# Patient Record
Sex: Male | Born: 1935 | Race: White | Hispanic: No | Smoking: Former smoker
Health system: Southern US, Community
[De-identification: ages and names within clinical notes are randomized; demographics above are authoritative.]

## PROBLEM LIST (undated history)

## (undated) DIAGNOSIS — E785 Hyperlipidemia, unspecified: Secondary | ICD-10-CM

## (undated) DIAGNOSIS — J189 Pneumonia, unspecified organism: Secondary | ICD-10-CM

## (undated) DIAGNOSIS — I10 Essential (primary) hypertension: Secondary | ICD-10-CM

## (undated) DIAGNOSIS — Z531 Procedure and treatment not carried out because of patient's decision for reasons of belief and group pressure: Secondary | ICD-10-CM

## (undated) HISTORY — PX: TONSILLECTOMY: SUR1361

---

## 1998-09-22 ENCOUNTER — Inpatient Hospital Stay (HOSPITAL_COMMUNITY): Admission: EM | Admit: 1998-09-22 | Discharge: 1998-09-24 | Payer: Self-pay | Admitting: Emergency Medicine

## 1998-09-25 ENCOUNTER — Encounter: Payer: Self-pay | Admitting: Cardiology

## 2003-06-19 ENCOUNTER — Encounter: Admission: RE | Admit: 2003-06-19 | Discharge: 2003-06-19 | Payer: Self-pay | Admitting: Family Medicine

## 2014-03-13 ENCOUNTER — Emergency Department (HOSPITAL_COMMUNITY): Payer: Medicare HMO

## 2014-03-13 ENCOUNTER — Inpatient Hospital Stay (HOSPITAL_COMMUNITY): Payer: Medicare HMO

## 2014-03-13 ENCOUNTER — Encounter (HOSPITAL_COMMUNITY): Payer: Self-pay | Admitting: Emergency Medicine

## 2014-03-13 ENCOUNTER — Inpatient Hospital Stay (HOSPITAL_COMMUNITY)
Admission: EM | Admit: 2014-03-13 | Discharge: 2014-03-17 | DRG: 481 | Disposition: A | Discharge status: Home Health | Payer: Medicare HMO | Attending: Internal Medicine | Admitting: Internal Medicine

## 2014-03-13 DIAGNOSIS — Z887 Allergy status to serum and vaccine status: Secondary | ICD-10-CM

## 2014-03-13 DIAGNOSIS — IMO0001 Reserved for inherently not codable concepts without codable children: Secondary | ICD-10-CM

## 2014-03-13 DIAGNOSIS — E785 Hyperlipidemia, unspecified: Secondary | ICD-10-CM | POA: Diagnosis present

## 2014-03-13 DIAGNOSIS — Z789 Other specified health status: Secondary | ICD-10-CM | POA: Diagnosis present

## 2014-03-13 DIAGNOSIS — Y92009 Unspecified place in unspecified non-institutional (private) residence as the place of occurrence of the external cause: Secondary | ICD-10-CM | POA: Diagnosis not present

## 2014-03-13 DIAGNOSIS — D72829 Elevated white blood cell count, unspecified: Secondary | ICD-10-CM | POA: Diagnosis not present

## 2014-03-13 DIAGNOSIS — S72033A Displaced midcervical fracture of unspecified femur, initial encounter for closed fracture: Secondary | ICD-10-CM | POA: Diagnosis present

## 2014-03-13 DIAGNOSIS — E871 Hypo-osmolality and hyponatremia: Secondary | ICD-10-CM | POA: Diagnosis not present

## 2014-03-13 DIAGNOSIS — W010XXA Fall on same level from slipping, tripping and stumbling without subsequent striking against object, initial encounter: Secondary | ICD-10-CM | POA: Diagnosis present

## 2014-03-13 DIAGNOSIS — Z79899 Other long term (current) drug therapy: Secondary | ICD-10-CM

## 2014-03-13 DIAGNOSIS — E861 Hypovolemia: Secondary | ICD-10-CM | POA: Diagnosis present

## 2014-03-13 DIAGNOSIS — Z87891 Personal history of nicotine dependence: Secondary | ICD-10-CM

## 2014-03-13 DIAGNOSIS — Z8249 Family history of ischemic heart disease and other diseases of the circulatory system: Secondary | ICD-10-CM | POA: Diagnosis not present

## 2014-03-13 DIAGNOSIS — K5909 Other constipation: Secondary | ICD-10-CM | POA: Diagnosis present

## 2014-03-13 DIAGNOSIS — Z880 Allergy status to penicillin: Secondary | ICD-10-CM

## 2014-03-13 DIAGNOSIS — M25559 Pain in unspecified hip: Secondary | ICD-10-CM | POA: Diagnosis not present

## 2014-03-13 DIAGNOSIS — K5900 Constipation, unspecified: Secondary | ICD-10-CM | POA: Diagnosis not present

## 2014-03-13 DIAGNOSIS — Z7982 Long term (current) use of aspirin: Secondary | ICD-10-CM

## 2014-03-13 DIAGNOSIS — S72002A Fracture of unspecified part of neck of left femur, initial encounter for closed fracture: Secondary | ICD-10-CM

## 2014-03-13 DIAGNOSIS — I10 Essential (primary) hypertension: Secondary | ICD-10-CM | POA: Diagnosis present

## 2014-03-13 DIAGNOSIS — S72009A Fracture of unspecified part of neck of unspecified femur, initial encounter for closed fracture: Secondary | ICD-10-CM

## 2014-03-13 DIAGNOSIS — Z8262 Family history of osteoporosis: Secondary | ICD-10-CM

## 2014-03-13 HISTORY — DX: Pneumonia, unspecified organism: J18.9

## 2014-03-13 HISTORY — DX: Essential (primary) hypertension: I10

## 2014-03-13 HISTORY — DX: Hyperlipidemia, unspecified: E78.5

## 2014-03-13 LAB — CBC WITH DIFFERENTIAL/PLATELET
Basophils Absolute: 0 10*3/uL (ref 0.0–0.1)
Basophils Relative: 0 % (ref 0–1)
Eosinophils Absolute: 0 10*3/uL (ref 0.0–0.7)
Eosinophils Relative: 0 % (ref 0–5)
HCT: 36.7 % — ABNORMAL LOW (ref 39.0–52.0)
Hemoglobin: 12.6 g/dL — ABNORMAL LOW (ref 13.0–17.0)
Lymphocytes Relative: 6 % — ABNORMAL LOW (ref 12–46)
Lymphs Abs: 0.7 10*3/uL (ref 0.7–4.0)
MCH: 30.4 pg (ref 26.0–34.0)
MCHC: 34.3 g/dL (ref 30.0–36.0)
MCV: 88.6 fL (ref 78.0–100.0)
Monocytes Absolute: 0.7 10*3/uL (ref 0.1–1.0)
Monocytes Relative: 6 % (ref 3–12)
Neutro Abs: 9.9 10*3/uL — ABNORMAL HIGH (ref 1.7–7.7)
Neutrophils Relative %: 88 % — ABNORMAL HIGH (ref 43–77)
Platelets: 221 10*3/uL (ref 150–400)
RBC: 4.14 MIL/uL — ABNORMAL LOW (ref 4.22–5.81)
RDW: 12.7 % (ref 11.5–15.5)
WBC: 11.4 10*3/uL — ABNORMAL HIGH (ref 4.0–10.5)

## 2014-03-13 LAB — BASIC METABOLIC PANEL
Anion gap: 14 (ref 5–15)
BUN: 15 mg/dL (ref 6–23)
CO2: 23 mEq/L (ref 19–32)
Calcium: 9 mg/dL (ref 8.4–10.5)
Chloride: 97 mEq/L (ref 96–112)
Creatinine, Ser: 0.83 mg/dL (ref 0.50–1.35)
GFR calc Af Amer: >90 mL/min (ref >90)
GFR calc non Af Amer: 80 mL/min — ABNORMAL LOW (ref >90)
Glucose, Bld: 119 mg/dL — ABNORMAL HIGH (ref 70–99)
Potassium: 3.8 mEq/L (ref 3.7–5.3)
Sodium: 134 mEq/L — ABNORMAL LOW (ref 137–147)

## 2014-03-13 MED ORDER — MORPHINE SULFATE 4 MG/ML IJ SOLN
6.0000 mg | Freq: Once | INTRAMUSCULAR | Status: AC
  Administered 2014-03-13: 6 mg via INTRAVENOUS
  Filled 2014-03-13: qty 2

## 2014-03-13 MED ORDER — MORPHINE SULFATE 4 MG/ML IJ SOLN
6.0000 mg | Freq: Once | INTRAMUSCULAR | Status: AC
Start: 2014-03-13 — End: 2014-03-13
  Administered 2014-03-13: 6 mg via INTRAVENOUS
  Filled 2014-03-13: qty 2

## 2014-03-13 MED ORDER — PNEUMOCOCCAL VAC POLYVALENT 25 MCG/0.5ML IJ INJ
0.5000 mL | INJECTION | INTRAMUSCULAR | Status: DC
  Filled 2014-03-13: qty 0.5

## 2014-03-13 MED ORDER — TETANUS-DIPHTH-ACELL PERTUSSIS 5-2.5-18.5 LF-MCG/0.5 IM SUSP
0.5000 mL | Freq: Once | INTRAMUSCULAR | Status: DC
  Filled 2014-03-13: qty 0.5

## 2014-03-13 MED ORDER — INFLUENZA VAC SPLIT QUAD 0.5 ML IM SUSY
0.5000 mL | PREFILLED_SYRINGE | INTRAMUSCULAR | Status: DC
  Filled 2014-03-13: qty 0.5

## 2014-03-13 MED ORDER — MORPHINE SULFATE 2 MG/ML IJ SOLN
0.5000 mg | INTRAMUSCULAR | Status: DC | PRN
  Administered 2014-03-13 – 2014-03-14 (×3): 0.5 mg via INTRAVENOUS
  Filled 2014-03-13 (×3): qty 1

## 2014-03-13 MED ORDER — HYDROCODONE-ACETAMINOPHEN 5-325 MG PO TABS
1.0000 | ORAL_TABLET | Freq: Four times a day (QID) | ORAL | Status: DC | PRN
  Administered 2014-03-13 – 2014-03-16 (×8): 2 via ORAL
  Administered 2014-03-17 (×2): 1 via ORAL
  Filled 2014-03-13 (×8): qty 2
  Filled 2014-03-13 (×2): qty 1

## 2014-03-13 MED ORDER — ONDANSETRON HCL 4 MG/2ML IJ SOLN
4.0000 mg | Freq: Once | INTRAMUSCULAR | Status: AC
  Administered 2014-03-13: 4 mg via INTRAVENOUS
  Filled 2014-03-13: qty 2

## 2014-03-13 NOTE — ED Notes (Signed)
[facility] Called to [facility]

## 2014-03-13 NOTE — Progress Notes (Signed)
  CARE MANAGEMENT ED NOTE 03/13/2014  Patient:  Clayton Odonnell, Clayton Odonnell   Account Number:  [id number]  Date Initiated:  03/13/2014  Documentation initiated by:  Livia Snellen  Subjective/Objective Assessment:   Patient presents to ED post fall with injury to left hip.     Subjective/Objective Assessment Detail:     Action/Plan:   Action/Plan Detail:   Anticipated DC Date:       Status Recommendation to Physician:   Result of Recommendation:    Other ED [place]  Other  PCP issues    Choice offered to / List presented to:            Status of service:  Completed, signed off  ED Comments:   ED Comments Detail:  EDCm spoke to patient at bedside.  Patient confirms his pcp is Dr. Bevelyn Buckles.  System updated.  No further EDCM needs at this time.

## 2014-03-13 NOTE — ED Notes (Signed)
Per EMS pt from home tripped on way in house resulting in left hip pain, shortening, outward rotation.

## 2014-03-13 NOTE — ED Notes (Signed)
Pt reports post xray and movement pain 6/10 in left hip.

## 2014-03-13 NOTE — ED Notes (Signed)
Bed: WA13 Expected date:  Expected time:  Means of arrival:  Comments: ems  

## 2014-03-13 NOTE — ED Provider Notes (Signed)
CSN: [id number]     Arrival date & time 03/13/14  1517 History   First MD Initiated Contact with Patient 03/13/14 1521     Chief Complaint  Patient presents with  . Fall  . Hip Pain     (Consider location/radiation/quality/duration/timing/severity/associated sxs/prior Treatment) HPI  83yM with L hip pain. Mechanical fall. Happened just before arrival. Lost balance. Skin tear L elbow, but denies significant pain there. Did not hit head. No HA, neck or back pain. Tried to get up after fall but unable to bear weight because of pain. No blood thinners.   Past Medical History  Diagnosis Date  . Hypertension   . Hyperlipemia    History reviewed. No pertinent past surgical history. No family history on file. History  Substance Use Topics  . Smoking status: Never Smoker   . Smokeless tobacco: Not on file  . Alcohol Use: 8.4 oz/week    14 Cans of beer per week     Comment: 2 beers a day    Review of Systems  All systems reviewed and negative, other than as noted in HPI.   Allergies  Penicillins  Home Medications   Prior to Admission medications   Medication Sig Start Date End Date Taking? Authorizing Provider  lovastatin (MEVACOR) 10 MG tablet Take 10 mg by mouth at bedtime.   Yes Historical Provider, MD  triamterene-hydrochlorothiazide (MAXZIDE-25) 37.5-25 MG per tablet Take 1 tablet by mouth daily.   Yes Historical Provider, MD   BP 145/69  Pulse 76  Temp(Src) 97.9 F (36.6 C) (Oral)  Resp 16  SpO2 98% Physical Exam  Nursing note and vitals reviewed. Constitutional: He appears well-developed and well-nourished. No distress.  HENT:  Head: Normocephalic and atraumatic.  Eyes: Conjunctivae are normal. Right eye exhibits no discharge. Left eye exhibits no discharge.  Neck: Neck supple.  Cardiovascular: Normal rate, regular rhythm and normal heart sounds.  Exam reveals no gallop and no friction rub.   No murmur heard. Pulmonary/Chest: Effort normal and breath sounds  normal. No respiratory distress.  Abdominal: Soft. He exhibits no distension. There is no tenderness.  Musculoskeletal:  Significant pain with attempted ROM of L hip. Perhaps some mild shortening. Externally rotated. NVI.   Neurological: He is alert.  Skin: Skin is warm and dry.  Psychiatric: He has a normal mood and affect. His behavior is normal. Thought content normal.    ED Course  Procedures (including critical care time) Labs Review Labs Reviewed  CBC WITH DIFFERENTIAL - Abnormal; Notable for the following:    WBC 11.4 (*)    RBC 4.14 (*)    Hemoglobin 12.6 (*)    HCT 36.7 (*)    Neutrophils Relative % 88 (*)    Neutro Abs 9.9 (*)    Lymphocytes Relative 6 (*)    All other components within normal limits  BASIC METABOLIC PANEL - Abnormal; Notable for the following:    Sodium 134 (*)    Glucose, Bld 119 (*)    GFR calc non Af Amer 80 (*)    All other components within normal limits  CBC - Abnormal; Notable for the following:    RBC 4.16 (*)    Hemoglobin 12.7 (*)    HCT 36.7 (*)    All other components within normal limits  BASIC METABOLIC PANEL - Abnormal; Notable for the following:    Sodium 127 (*)    Chloride 90 (*)    Glucose, Bld 114 (*)    GFR calc  non Af Amer 82 (*)    All other components within normal limits  URINALYSIS, ROUTINE W REFLEX MICROSCOPIC - Abnormal; Notable for the following:    Ketones, ur 15 (*)    All other components within normal limits  BASIC METABOLIC PANEL - Abnormal; Notable for the following:    Sodium 130 (*)    Chloride 93 (*)    Glucose, Bld 173 (*)    GFR calc non Af Amer 83 (*)    All other components within normal limits  CBC - Abnormal; Notable for the following:    WBC 14.3 (*)    RBC 4.07 (*)    Hemoglobin 12.4 (*)    HCT 35.6 (*)    All other components within normal limits  CBC - Abnormal; Notable for the following:    RBC 3.95 (*)    Hemoglobin 12.3 (*)    HCT 34.6 (*)    All other components within normal  limits  BASIC METABOLIC PANEL - Abnormal; Notable for the following:    Sodium 132 (*)    Chloride 95 (*)    Glucose, Bld 114 (*)    GFR calc non Af Amer 86 (*)    All other components within normal limits  URINE CULTURE  SURGICAL PCR SCREEN  VITAMIN D 25 HYDROXY  ALBUMIN    Imaging Review Dg Hip Complete Left  03/13/2014   CLINICAL DATA:  Fall, left hip pain  EXAM: LEFT HIP - COMPLETE 2+ VIEW  COMPARISON:  None.  FINDINGS: Nondisplaced transcervical left hip fracture.  Mild degenerative changes of the bilateral hips.  Visualized bony pelvis appears intact.  Degenerative changes of the lower lumbar spine.  IMPRESSION: Nondisplaced transcervical left hip fracture.   Electronically Signed   By: Julian Hy M.D.   On: 03/13/2014 16:44     EKG Interpretation   Date/Time:  Monday March 13 2014 17:07:55 EDT Ventricular Rate:  80 PR Interval:  236 QRS Duration: 107 QT Interval:  420 QTC Calculation: 484 R Axis:   -60 Text Interpretation:  Sinus rhythm Prolonged PR interval Low voltage,  precordial leads RSR' in V1 or V2, right VCD or RVH  ABNORMAL R WAVE  PROGRESSION inferior Q waves No old tracing to compare Confirmed by [place]man   MD, Laniya Friedl ([id number]) on 03/13/2014 5:12:02 PM      MDM   Final diagnoses:  Femoral neck fracture, left, closed, initial encounter    83yM with L hip pain after mechanical fall. Closed L femoral neck fx. Discussed with Dr Alain Marion, ortho. Plans OR tomorrow. NPO after midnight. Requesting transfer to [facility]. Basic pre-op testing. Will discuss with medicine.     Virgel Manifold, MD 03/17/14 [phone]

## 2014-03-13 NOTE — Consult Note (Signed)
ORTHOPAEDIC CONSULTATION  REQUESTING PHYSICIAN: Virgel Manifold, MD  Chief Complaint: left hip fracture  HPI: Clayton Odonnell is a 80 y.o. male who complains of  Mechanical fall. He is a Hydrographic surveyor  Past Medical History  Diagnosis Date  . Hypertension   . Hyperlipemia   . Pneumonia    Past Surgical History  Procedure Laterality Date  . Tonsillectomy     History   Social History  . Marital Status: Married    Spouse Name: N/A    Number of Children: N/A  . Years of Education: N/A   Social History Main Topics  . Smoking status: Former Smoker    Types: Cigarettes    Quit date: 03/13/1953  . Smokeless tobacco: Never Used  . Alcohol Use: 8.4 oz/week    14 Cans of beer per week     Comment: 2 beers a day  . Drug Use: No  . Sexual Activity: None   Other Topics Concern  . None   Social History Narrative  . None   History reviewed. No pertinent family history. Allergies  Allergen Reactions  . Tetanus Toxoids   . Other   . Penicillins Hives and Rash   Prior to Admission medications   Medication Sig Start Date End Date Taking? Authorizing Provider  lovastatin (MEVACOR) 10 MG tablet Take 10 mg by mouth at bedtime.   Yes Historical Provider, MD  triamterene-hydrochlorothiazide (MAXZIDE-25) 37.5-25 MG per tablet Take 1 tablet by mouth daily.   Yes Historical Provider, MD   Dg Hip Complete Left  03/13/2014   CLINICAL DATA:  Fall, left hip pain  EXAM: LEFT HIP - COMPLETE 2+ VIEW  COMPARISON:  None.  FINDINGS: Nondisplaced transcervical left hip fracture.  Mild degenerative changes of the bilateral hips.  Visualized bony pelvis appears intact.  Degenerative changes of the lower lumbar spine.  IMPRESSION: Nondisplaced transcervical left hip fracture.   Electronically Signed   By: Julian Hy M.D.   On: 03/13/2014 16:44   Dg Chest Portable 1 View  03/13/2014   CLINICAL DATA:  Status post fall, hip pain, cough  EXAM: PORTABLE CHEST - 1 VIEW   COMPARISON:  None.  FINDINGS: The heart size and mediastinal contours are within normal limits. Both lungs are clear. The visualized skeletal structures are unremarkable.  IMPRESSION: No active disease.   Electronically Signed   By: Kathreen Devoid   On: 03/13/2014 17:07    Positive ROS: All other systems have been reviewed and were otherwise negative with the exception of those mentioned in the HPI and as above.  Labs cbc  Recent Labs  03/13/14 1734  WBC 11.4*  HGB 12.6*  HCT 36.7*  PLT 221    Labs inflam No results found for this basename: ESR, CRP,  in the last 72 hours  Labs coag No results found for this basename: INR, PT, PTT,  in the last 72 hours   Recent Labs  03/13/14 1734  NA 134*  K 3.8  CL 97  CO2 23  GLUCOSE 119*  BUN 15  CREATININE 0.83  CALCIUM 9.0    Physical Exam: Filed Vitals:   03/13/14 1801  BP: 141/69  Pulse: 83  Temp:   Resp: 16   General: Alert, no acute distress Cardiovascular: No pedal edema Respiratory: No cyanosis, no use of accessory musculature GI: No organomegaly, abdomen is soft and non-tender Skin: No lesions in the area of chief complaint other than those listed below in MSK  exam.  Neurologic: Sensation intact distally Psychiatric: Patient is competent for consent with normal mood and affect Lymphatic: No axillary or cervical lymphadenopathy  MUSCULOSKELETAL:  LLE: pain with log roll. SILT DP/SP/S/S/T nerve, 2+ DP, +TA/GS/EHL Compartments soft  Other extremities are atraumatic with painless ROM and NVI.  Assessment: Left basicervical fem neck fx  Plan: IM nail today.  Weight Bearing Status: Bedrest now, likely TDWB post op PT VTE px: SCD's and chemical post op   Edmonia Lynch, D, MD Cell [phone]   03/13/2014 6:57 PM

## 2014-03-13 NOTE — Progress Notes (Signed)
03/13/2014 A. Ikenna Ohms RNCM 2105pm EDCM spoke to patient at bedside.  Patient reports he does not want to go to a "nursing home" to get rehab after his surgery.  Patient reports his wife is at home who just had a stroke.  Patient reports she is unable to care for herself and the patient is her caretaker.  Patient would rather go home on discharge with outpatient rehab.  Patient reports his family will be able to help at home as well.  EDCM provided patient with list of home health agencies in [facility].  EDCM explained to patient that he may receive a visiting RN, PT, OT aide and social worker if needed.  Patient thankful for resources. No further EDCM needs at this time.

## 2014-03-13 NOTE — H&P (Addendum)
PCP: Donnajean Lopes, MD    Chief Complaint:  Fall at home  HPI: Clayton Odonnell is a 80 y.o. male   has a past medical history of Hypertension; Hyperlipemia; and Pneumonia.   Presented with  Patient was bringing his wife from the hospital she was recently hospitalized with CVA. He tripped and fell hitting his head on the wall. They called 911 and he was brought to ER. Patient was unable to bear weight.  Denies LOS. He have had some nausea afterwards. Orthopedics have been called and recommended admission to [facility] with OR time in AM. Patient denies any chest pain he has been active. No chest pain with exertion.  Patient is a Sales promotion account executive witness.   Hospitalist was called for admission for Left hip fracture.  Review of Systems:    Pertinent positives include:  Fall and pain in left hip. nausea,   Constitutional:  No weight loss, night sweats, Fevers, chills, fatigue, weight loss  HEENT:  No headaches, Difficulty swallowing,Tooth/dental problems,Sore throat,  No sneezing, itching, ear ache, nasal congestion, post nasal drip,  Cardio-vascular:  No chest pain, Orthopnea, PND, anasarca, dizziness, palpitations.no Bilateral lower extremity swelling  GI:  No heartburn, indigestion, abdominal pain, vomiting, diarrhea, change in bowel habits, loss of appetite, melena, blood in stool, hematemesis Resp:  no shortness of breath at rest. No dyspnea on exertion, No excess mucus, no productive cough, No non-productive cough, No coughing up of blood.No change in color of mucus.No wheezing. Skin:  no rash or lesions. No jaundice GU:  no dysuria, change in color of urine, no urgency or frequency. No straining to urinate.  No flank pain.  Musculoskeletal:  No joint pain or no joint swelling. No decreased range of motion. No back pain.  Psych:  No change in mood or affect. No depression or anxiety. No memory loss.  Neuro: no localizing neurological complaints, no tingling, no weakness, no  double vision, no gait abnormality, no slurred speech, no confusion  Otherwise ROS are negative except for above, 10 systems were reviewed  Past Medical History: Past Medical History  Diagnosis Date  . Hypertension   . Hyperlipemia   . Pneumonia    Past Surgical History  Procedure Laterality Date  . Tonsillectomy       Medications: Prior to Admission medications   Medication Sig Start Date End Date Taking? Authorizing Provider  lovastatin (MEVACOR) 10 MG tablet Take 10 mg by mouth at bedtime.   Yes Historical Provider, MD  triamterene-hydrochlorothiazide (MAXZIDE-25) 37.5-25 MG per tablet Take 1 tablet by mouth daily.   Yes Historical Provider, MD    Allergies:   Allergies  Allergen Reactions  . Tetanus Toxoids   . Other   . Penicillins Hives and Rash    Social History:  Ambulatory   independently   Lives at home   With family     reports that he quit smoking about 61 years ago. His smoking use included Cigarettes. He smoked 0.00 packs per day. He has never used smokeless tobacco. He reports that he drinks about 8.4 ounces of alcohol per week. He reports that he does not use illicit drugs.    Family History: family history includes CAD in his brother; Osteoporosis in his daughter.    Physical Exam: Patient Vitals for the past 24 hrs:  BP Temp Temp src Pulse Resp SpO2  03/13/14 1801 141/69 mmHg - - 83 16 96 %  03/13/14 1738 146/57 mmHg - - 88 20 94 %  03/13/14 1645 145/69 mmHg - - 76 16 98 %  03/13/14 1610 141/77 mmHg - - 71 16 98 %  03/13/14 1553 158/65 mmHg - - 74 16 100 %  03/13/14 1537 142/69 mmHg 97.9 F (36.6 C) Oral 87 - 100 %  03/13/14 1517 - - - - - 99 %    1. General:  in No Acute distress 2. Psychological: Alert and Oriented 3. Head/ENT:   Moist   Mucous Membranes                          Head Non traumatic, neck supple                          Normal   Dentition 4. SKIN: decreased Skin turgor,  Skin clean Dry and intact no rash 5. Heart:  Regular rate and rhythm no Murmur, Rub or gallop 6. Lungs: Clear to auscultation bilaterally, no wheezes or crackles   7. Abdomen: Soft, non-tender, Non distended 8. Lower extremities: no clubbing, cyanosis, or edema 9. Neurologically Grossly intact, moving all 4 extremities equally 10. MSK: Normal range of motion  body mass index is unknown because there is no height or weight on file.   Labs on Admission:   Recent Labs  03/13/14 1734  NA 134*  K 3.8  CL 97  CO2 23  GLUCOSE 119*  BUN 15  CREATININE 0.83  CALCIUM 9.0   No results found for this basename: AST, ALT, ALKPHOS, BILITOT, PROT, ALBUMIN,  in the last 72 hours No results found for this basename: LIPASE, AMYLASE,  in the last 72 hours  Recent Labs  03/13/14 1734  WBC 11.4*  NEUTROABS 9.9*  HGB 12.6*  HCT 36.7*  MCV 88.6  PLT 221   No results found for this basename: CKTOTAL, CKMB, CKMBINDEX, TROPONINI,  in the last 72 hours No results found for this basename: TSH, T4TOTAL, FREET3, T3FREE, THYROIDAB,  in the last 72 hours No results found for this basename: VITAMINB12, FOLATE, FERRITIN, TIBC, IRON, RETICCTPCT,  in the last 72 hours No results found for this basename: HGBA1C    CrCl is unknown because there is no height on file for the current visit. ABG No results found for this basename: phart, pco2, po2, hco3, tco2, acidbasedef, o2sat     No results found for this basename: DDIMER     Other results:  I have pearsonaly reviewed this: ECG REPORT  Rate: 80  Rhythm: NSR ST&T Change: no ischemia   BNP (last 3 results) No results found for this basename: PROBNP,  in the last 8760 hours  There were no vitals filed for this visit.   Cultures: No results found for this basename: sdes, specrequest, cult, reptstatus   Radiological Exams on Admission: Dg Hip Complete Left  03/13/2014   CLINICAL DATA:  Fall, left hip pain  EXAM: LEFT HIP - COMPLETE 2+ VIEW  COMPARISON:  None.  FINDINGS: Nondisplaced  transcervical left hip fracture.  Mild degenerative changes of the bilateral hips.  Visualized bony pelvis appears intact.  Degenerative changes of the lower lumbar spine.  IMPRESSION: Nondisplaced transcervical left hip fracture.   Electronically Signed   By: Julian Hy M.D.   On: 03/13/2014 16:44   Dg Chest Portable 1 View  03/13/2014   CLINICAL DATA:  Status post fall, hip pain, cough  EXAM: PORTABLE CHEST - 1 VIEW  COMPARISON:  None.  FINDINGS:  The heart size and mediastinal contours are within normal limits. Both lungs are clear. The visualized skeletal structures are unremarkable.  IMPRESSION: No active disease.   Electronically Signed   By: Kathreen Devoid   On: 03/13/2014 17:07    Chart has been reviewed  Assessment/Plan  80 yo M with past history of mild well controlled HTN here after mechanical fall resulting in Left hip fracture  Present on Admission:  . Closed left hip fracture - Moderate risk given advanced age but no further cardiac work up is  Indicated. Perioperative betablocker ordered. NPO post midnight. Patient is Jahova's witness and requesting no blood products. Hip fracture repair, DVT prophylaxis as per orthopedics . Hypertension- metoprolol IV for now perioperatively, hold HCTZ Given fall and hitting head will obtain CT of the head without contrast.  Prophylaxis: SCD , Protonix  CODE STATUS: LIMITED CODE   Other plan as per orders.  I have spent a total of 55 min on this admission  Clayton Odonnell 03/13/2014, 7:11 PM  [facility]  Pager [phone]   If 7AM-7PM, please contact the day team taking care of the patient  [email or web address]  Password [email or web address]

## 2014-03-14 ENCOUNTER — Encounter (HOSPITAL_COMMUNITY)
Admission: EM | Disposition: A | Discharge status: Home Health | Payer: Self-pay | Source: Home / Self Care | Attending: Internal Medicine

## 2014-03-14 ENCOUNTER — Encounter (HOSPITAL_COMMUNITY): Payer: Self-pay | Admitting: Certified Registered"

## 2014-03-14 ENCOUNTER — Encounter (HOSPITAL_COMMUNITY): Payer: Medicare HMO | Admitting: Certified Registered"

## 2014-03-14 ENCOUNTER — Inpatient Hospital Stay (HOSPITAL_COMMUNITY): Payer: Medicare HMO

## 2014-03-14 ENCOUNTER — Inpatient Hospital Stay (HOSPITAL_COMMUNITY): Payer: Medicare HMO | Admitting: Certified Registered"

## 2014-03-14 DIAGNOSIS — I10 Essential (primary) hypertension: Secondary | ICD-10-CM

## 2014-03-14 DIAGNOSIS — E785 Hyperlipidemia, unspecified: Secondary | ICD-10-CM

## 2014-03-14 DIAGNOSIS — IMO0001 Reserved for inherently not codable concepts without codable children: Secondary | ICD-10-CM | POA: Diagnosis present

## 2014-03-14 DIAGNOSIS — Z789 Other specified health status: Secondary | ICD-10-CM

## 2014-03-14 HISTORY — PX: FEMUR IM NAIL: SHX1597

## 2014-03-14 LAB — BASIC METABOLIC PANEL
Anion gap: 12 (ref 5–15)
BUN: 10 mg/dL (ref 6–23)
CHLORIDE: 90 meq/L — AB (ref 96–112)
CO2: 25 mEq/L (ref 19–32)
Calcium: 8.6 mg/dL (ref 8.4–10.5)
Creatinine, Ser: 0.8 mg/dL (ref 0.50–1.35)
GFR, EST NON AFRICAN AMERICAN: 82 mL/min — AB (ref >90)
Glucose, Bld: 114 mg/dL — ABNORMAL HIGH (ref 70–99)
Potassium: 3.7 mEq/L (ref 3.7–5.3)
SODIUM: 127 meq/L — AB (ref 137–147)

## 2014-03-14 LAB — URINALYSIS, ROUTINE W REFLEX MICROSCOPIC
Bilirubin Urine: NEGATIVE
Glucose, UA: NEGATIVE mg/dL
Hgb urine dipstick: NEGATIVE
Ketones, ur: 15 mg/dL — AB
LEUKOCYTES UA: NEGATIVE
NITRITE: NEGATIVE
PROTEIN: NEGATIVE mg/dL
SPECIFIC GRAVITY, URINE: 1.022 (ref 1.005–1.030)
UROBILINOGEN UA: 0.2 mg/dL (ref 0.0–1.0)
pH: 6 (ref 5.0–8.0)

## 2014-03-14 LAB — CBC
HEMATOCRIT: 36.7 % — AB (ref 39.0–52.0)
HEMOGLOBIN: 12.7 g/dL — AB (ref 13.0–17.0)
MCH: 30.5 pg (ref 26.0–34.0)
MCHC: 34.6 g/dL (ref 30.0–36.0)
MCV: 88.2 fL (ref 78.0–100.0)
Platelets: 209 10*3/uL (ref 150–400)
RBC: 4.16 MIL/uL — ABNORMAL LOW (ref 4.22–5.81)
RDW: 12.7 % (ref 11.5–15.5)
WBC: 9.5 10*3/uL (ref 4.0–10.5)

## 2014-03-14 LAB — ALBUMIN: ALBUMIN: 3.7 g/dL (ref 3.5–5.2)

## 2014-03-14 LAB — SURGICAL PCR SCREEN
MRSA, PCR: NEGATIVE
STAPHYLOCOCCUS AUREUS: NEGATIVE

## 2014-03-14 SURGERY — FIXATION, FEMUR, NECK, PERCUTANEOUS, USING SCREW
Anesthesia: General | Laterality: Left

## 2014-03-14 SURGERY — INSERTION, INTRAMEDULLARY ROD, FEMUR
Anesthesia: General | Site: Hip | Laterality: Left

## 2014-03-14 MED ORDER — ROCURONIUM BROMIDE 100 MG/10ML IV SOLN
INTRAVENOUS | Status: DC | PRN
  Administered 2014-03-14: 40 mg via INTRAVENOUS

## 2014-03-14 MED ORDER — HYDROCODONE-ACETAMINOPHEN 5-325 MG PO TABS: 1.0000 | ORAL_TABLET | ORAL | Status: DC | PRN

## 2014-03-14 MED ORDER — DROPERIDOL 2.5 MG/ML IJ SOLN
0.6250 mg | INTRAMUSCULAR | Status: DC | PRN
  Filled 2014-03-14: qty 0.25

## 2014-03-14 MED ORDER — ACETAMINOPHEN 650 MG RE SUPP: 650.0000 mg | Freq: Four times a day (QID) | RECTAL | Status: DC | PRN

## 2014-03-14 MED ORDER — ONDANSETRON HCL 4 MG/2ML IJ SOLN
INTRAMUSCULAR | Status: DC | PRN
  Administered 2014-03-14: 4 mg via INTRAVENOUS

## 2014-03-14 MED ORDER — ASPIRIN EC 325 MG PO TBEC
325.0000 mg | DELAYED_RELEASE_TABLET | Freq: Every day | ORAL | Status: DC
  Administered 2014-03-15 – 2014-03-17 (×3): 325 mg via ORAL
  Filled 2014-03-14 (×4): qty 1

## 2014-03-14 MED ORDER — LIDOCAINE HCL (CARDIAC) 20 MG/ML IV SOLN
INTRAVENOUS | Status: DC | PRN
  Administered 2014-03-14: 20 mg via INTRAVENOUS

## 2014-03-14 MED ORDER — PROPOFOL 10 MG/ML IV BOLUS
INTRAVENOUS | Status: DC | PRN
  Administered 2014-03-14: 100 mg via INTRAVENOUS

## 2014-03-14 MED ORDER — METHOCARBAMOL 1000 MG/10ML IJ SOLN
500.0000 mg | Freq: Four times a day (QID) | INTRAVENOUS | Status: DC | PRN
  Filled 2014-03-14: qty 5

## 2014-03-14 MED ORDER — INFLUENZA VAC SPLIT QUAD 0.5 ML IM SUSY
0.5000 mL | PREFILLED_SYRINGE | INTRAMUSCULAR | Status: AC
  Administered 2014-03-16: 0.5 mL via INTRAMUSCULAR
  Filled 2014-03-14: qty 0.5

## 2014-03-14 MED ORDER — EPHEDRINE SULFATE 50 MG/ML IJ SOLN
INTRAMUSCULAR | Status: DC | PRN
  Administered 2014-03-14: 10 mg via INTRAVENOUS

## 2014-03-14 MED ORDER — ACETAMINOPHEN 325 MG PO TABS: 650.0000 mg | ORAL_TABLET | Freq: Four times a day (QID) | ORAL | Status: DC | PRN

## 2014-03-14 MED ORDER — DEXAMETHASONE SODIUM PHOSPHATE 4 MG/ML IJ SOLN
INTRAMUSCULAR | Status: DC | PRN
  Administered 2014-03-14: 10 mg via INTRAVENOUS

## 2014-03-14 MED ORDER — METOPROLOL TARTRATE 1 MG/ML IV SOLN
2.5000 mg | Freq: Four times a day (QID) | INTRAVENOUS | Status: AC
  Administered 2014-03-14: 2.5 mg via INTRAVENOUS
  Filled 2014-03-14 (×3): qty 5

## 2014-03-14 MED ORDER — GLYCOPYRROLATE 0.2 MG/ML IJ SOLN
INTRAMUSCULAR | Status: DC | PRN
  Administered 2014-03-14: 0.2 mg via INTRAVENOUS
  Administered 2014-03-14: 0.4 mg via INTRAVENOUS
  Administered 2014-03-14: 0.2 mg via INTRAVENOUS

## 2014-03-14 MED ORDER — ASPIRIN EC 325 MG PO TBEC: 325.0000 mg | DELAYED_RELEASE_TABLET | Freq: Every day | ORAL | Status: DC

## 2014-03-14 MED ORDER — MIDAZOLAM HCL 2 MG/2ML IJ SOLN
INTRAMUSCULAR | Status: AC
  Filled 2014-03-14: qty 2

## 2014-03-14 MED ORDER — NEOSTIGMINE METHYLSULFATE 10 MG/10ML IV SOLN
INTRAVENOUS | Status: DC | PRN
  Administered 2014-03-14: 3 mg via INTRAVENOUS

## 2014-03-14 MED ORDER — CEFAZOLIN SODIUM-DEXTROSE 2-3 GM-% IV SOLR
INTRAVENOUS | Status: DC | PRN
  Administered 2014-03-14: 2 g via INTRAVENOUS

## 2014-03-14 MED ORDER — DOCUSATE SODIUM 100 MG PO CAPS
100.0000 mg | ORAL_CAPSULE | Freq: Two times a day (BID) | ORAL | Status: DC
  Administered 2014-03-14 – 2014-03-16 (×5): 100 mg via ORAL
  Filled 2014-03-14 (×9): qty 1

## 2014-03-14 MED ORDER — FENTANYL CITRATE 0.05 MG/ML IJ SOLN
INTRAMUSCULAR | Status: AC
  Filled 2014-03-14: qty 5

## 2014-03-14 MED ORDER — METHOCARBAMOL 500 MG PO TABS
500.0000 mg | ORAL_TABLET | Freq: Four times a day (QID) | ORAL | Status: DC | PRN
  Administered 2014-03-14 – 2014-03-17 (×2): 500 mg via ORAL
  Filled 2014-03-14 (×2): qty 1

## 2014-03-14 MED ORDER — FENTANYL CITRATE 0.05 MG/ML IJ SOLN: 25.0000 ug | INTRAMUSCULAR | Status: DC | PRN

## 2014-03-14 MED ORDER — FENTANYL CITRATE 0.05 MG/ML IJ SOLN
INTRAMUSCULAR | Status: DC | PRN
  Administered 2014-03-14: 150 ug via INTRAVENOUS

## 2014-03-14 MED ORDER — DIPHENHYDRAMINE HCL 50 MG/ML IJ SOLN: 10.0000 mg | Freq: Once | INTRAMUSCULAR | Status: DC

## 2014-03-14 MED ORDER — CEFAZOLIN SODIUM-DEXTROSE 2-3 GM-% IV SOLR
2.0000 g | Freq: Four times a day (QID) | INTRAVENOUS | Status: AC
  Administered 2014-03-14: 2 g via INTRAVENOUS
  Filled 2014-03-14 (×2): qty 50

## 2014-03-14 MED ORDER — 0.9 % SODIUM CHLORIDE (POUR BTL) OPTIME
TOPICAL | Status: DC | PRN
  Administered 2014-03-14: 1000 mL

## 2014-03-14 MED ORDER — PHENOL 1.4 % MT LIQD
1.0000 | OROMUCOSAL | Status: DC | PRN
  Filled 2014-03-14: qty 177

## 2014-03-14 MED ORDER — LACTATED RINGERS IV SOLN
INTRAVENOUS | Status: DC | PRN
  Administered 2014-03-14: 11:00:00 via INTRAVENOUS

## 2014-03-14 MED ORDER — PNEUMOCOCCAL VAC POLYVALENT 25 MCG/0.5ML IJ INJ
0.5000 mL | INJECTION | INTRAMUSCULAR | Status: AC
  Administered 2014-03-16: 0.5 mL via INTRAMUSCULAR
  Filled 2014-03-14: qty 0.5

## 2014-03-14 MED ORDER — DOCUSATE SODIUM 100 MG PO CAPS: 100.0000 mg | ORAL_CAPSULE | Freq: Two times a day (BID) | ORAL | Status: DC

## 2014-03-14 MED ORDER — FLUTICASONE PROPIONATE 50 MCG/ACT NA SUSP
1.0000 | Freq: Every day | NASAL | Status: DC
  Administered 2014-03-14 – 2014-03-16 (×3): 1 via NASAL
  Filled 2014-03-14: qty 16

## 2014-03-14 MED ORDER — METOCLOPRAMIDE HCL 10 MG PO TABS: 5.0000 mg | ORAL_TABLET | Freq: Three times a day (TID) | ORAL | Status: DC | PRN

## 2014-03-14 MED ORDER — METOCLOPRAMIDE HCL 5 MG/ML IJ SOLN: 5.0000 mg | Freq: Three times a day (TID) | INTRAMUSCULAR | Status: DC | PRN

## 2014-03-14 MED ORDER — DIPHENHYDRAMINE HCL 50 MG/ML IJ SOLN
INTRAMUSCULAR | Status: DC | PRN
  Administered 2014-03-14: 10 mg via INTRAVENOUS

## 2014-03-14 MED ORDER — MENTHOL 3 MG MT LOZG
1.0000 | LOZENGE | OROMUCOSAL | Status: DC | PRN
  Filled 2014-03-14: qty 9

## 2014-03-14 MED ORDER — ONDANSETRON HCL 4 MG/2ML IJ SOLN: 4.0000 mg | Freq: Four times a day (QID) | INTRAMUSCULAR | Status: DC | PRN

## 2014-03-14 MED ORDER — ONDANSETRON HCL 4 MG PO TABS: 4.0000 mg | ORAL_TABLET | Freq: Four times a day (QID) | ORAL | Status: DC | PRN

## 2014-03-14 SURGICAL SUPPLY — 30 items
COVER PERINEAL POST (MISCELLANEOUS) ×2 IMPLANT
COVER SURGICAL LIGHT HANDLE (MISCELLANEOUS) ×2 IMPLANT
DRAPE IMP U-DRAPE 54X76 (DRAPES) ×2 IMPLANT
DRAPE STERI IOBAN 125X83 (DRAPES) ×2 IMPLANT
DRSG EMULSION OIL 3X3 NADH (GAUZE/BANDAGES/DRESSINGS) ×2 IMPLANT
DRSG TEGADERM 4X4.75 (GAUZE/BANDAGES/DRESSINGS) ×2 IMPLANT
DURAPREP 26ML APPLICATOR (WOUND CARE) ×2 IMPLANT
ELECT REM PT RETURN 9FT ADLT (ELECTROSURGICAL) ×2
ELECTRODE REM PT RTRN 9FT ADLT (ELECTROSURGICAL) ×1 IMPLANT
GAUZE SPONGE 4X4 12PLY STRL (GAUZE/BANDAGES/DRESSINGS) ×2 IMPLANT
GLOVE BIO SURGEON STRL SZ7.5 (GLOVE) ×4 IMPLANT
GLOVE BIOGEL PI IND STRL 8 (GLOVE) ×1 IMPLANT
GLOVE BIOGEL PI INDICATOR 8 (GLOVE) ×1
GOWN STRL REUS W/ TWL LRG LVL3 (GOWN DISPOSABLE) ×1 IMPLANT
GOWN STRL REUS W/TWL LRG LVL3 (GOWN DISPOSABLE) ×1
KIT BASIN OR (CUSTOM PROCEDURE TRAY) ×2 IMPLANT
KIT ROOM TURNOVER OR (KITS) ×2 IMPLANT
LINER BOOT UNIVERSAL DISP (MISCELLANEOUS) ×2 IMPLANT
MANIFOLD NEPTUNE II (INSTRUMENTS) ×2 IMPLANT
NS IRRIG 1000ML POUR BTL (IV SOLUTION) ×2 IMPLANT
PACK GENERAL/GYN (CUSTOM PROCEDURE TRAY) ×2 IMPLANT
PAD ARMBOARD 7.5X6 YLW CONV (MISCELLANEOUS) ×4 IMPLANT
STAPLER VISISTAT 35W (STAPLE) ×2 IMPLANT
SUT MON AB 2-0 CT1 36 (SUTURE) ×2 IMPLANT
SUT VIC AB 0 CT1 27 (SUTURE) ×1
SUT VIC AB 0 CT1 27XBRD ANBCTR (SUTURE) ×1 IMPLANT
TOWEL OR 17X24 6PK STRL BLUE (TOWEL DISPOSABLE) ×2 IMPLANT
TOWEL OR 17X26 10 PK STRL BLUE (TOWEL DISPOSABLE) ×2 IMPLANT
TOWEL OR NON WOVEN STRL DISP B (DISPOSABLE) ×2 IMPLANT
WATER STERILE IRR 1000ML POUR (IV SOLUTION) ×2 IMPLANT

## 2014-03-14 SURGICAL SUPPLY — 38 items
CLSR STERI-STRIP ANTIMIC 1/2X4 (GAUZE/BANDAGES/DRESSINGS) ×2 IMPLANT
COVER PERINEAL POST (MISCELLANEOUS) ×2 IMPLANT
COVER SURGICAL LIGHT HANDLE (MISCELLANEOUS) ×2 IMPLANT
DRAPE STERI IOBAN 125X83 (DRAPES) ×2 IMPLANT
DRSG EMULSION OIL 3X3 NADH (GAUZE/BANDAGES/DRESSINGS) ×2 IMPLANT
DRSG MEPILEX BORDER 4X4 (GAUZE/BANDAGES/DRESSINGS) ×4 IMPLANT
DRSG TEGADERM 4X4.75 (GAUZE/BANDAGES/DRESSINGS) ×4 IMPLANT
DURAPREP 26ML APPLICATOR (WOUND CARE) ×2 IMPLANT
ELECT REM PT RETURN 9FT ADLT (ELECTROSURGICAL) ×2
ELECTRODE REM PT RTRN 9FT ADLT (ELECTROSURGICAL) ×1 IMPLANT
GAUZE SPONGE 4X4 12PLY STRL (GAUZE/BANDAGES/DRESSINGS) ×2 IMPLANT
GAUZE XEROFORM 1X8 LF (GAUZE/BANDAGES/DRESSINGS) ×2 IMPLANT
GLOVE BIO SURGEON STRL SZ7.5 (GLOVE) ×2 IMPLANT
GLOVE BIOGEL PI IND STRL 8 (GLOVE) ×1 IMPLANT
GLOVE BIOGEL PI INDICATOR 8 (GLOVE) ×1
GOWN STRL REUS W/ TWL LRG LVL3 (GOWN DISPOSABLE) ×1 IMPLANT
GOWN STRL REUS W/TWL LRG LVL3 (GOWN DISPOSABLE) ×1
GUIDEROD T2 3X1000 (ROD) ×2 IMPLANT
K-WIRE  3.2X450M STR (WIRE) ×1
K-WIRE 3.2X450M STR (WIRE) ×1
KIT BASIN OR (CUSTOM PROCEDURE TRAY) ×2 IMPLANT
KIT NAIL LONG 10X140X125 (Nail) ×2 IMPLANT
KIT ROOM TURNOVER OR (KITS) ×2 IMPLANT
KWIRE 3.2X450M STR (WIRE) ×1 IMPLANT
LINER BOOT UNIVERSAL DISP (MISCELLANEOUS) ×2 IMPLANT
MANIFOLD NEPTUNE II (INSTRUMENTS) ×2 IMPLANT
NS IRRIG 1000ML POUR BTL (IV SOLUTION) ×2 IMPLANT
PACK GENERAL/GYN (CUSTOM PROCEDURE TRAY) ×2 IMPLANT
PAD ARMBOARD 7.5X6 YLW CONV (MISCELLANEOUS) ×4 IMPLANT
SCREW LAG GAMMA 3 TI 10.5X105M (Screw) ×2 IMPLANT
STAPLER VISISTAT 35W (STAPLE) ×2 IMPLANT
SUT MON AB 2-0 CT1 36 (SUTURE) ×2 IMPLANT
SUT VIC AB 0 CT1 27 (SUTURE) ×1
SUT VIC AB 0 CT1 27XBRD ANBCTR (SUTURE) ×1 IMPLANT
TOWEL OR 17X24 6PK STRL BLUE (TOWEL DISPOSABLE) ×2 IMPLANT
TOWEL OR 17X26 10 PK STRL BLUE (TOWEL DISPOSABLE) ×2 IMPLANT
TOWEL OR NON WOVEN STRL DISP B (DISPOSABLE) ×2 IMPLANT
WATER STERILE IRR 1000ML POUR (IV SOLUTION) ×2 IMPLANT

## 2014-03-14 NOTE — Progress Notes (Signed)
Utilization review completed.  

## 2014-03-14 NOTE — Progress Notes (Signed)
Orthopedic Tech Progress Note Patient Details:  Clayton Odonnell [birth year] [id number]  Ortho Devices Ortho Device/Splint Location: trapeze bar patient helper Ortho Device/Splint Interventions: Application   Hildred Priest 03/14/2014, 5:49 PM

## 2014-03-14 NOTE — Op Note (Signed)
DATE OF SURGERY:  03/14/2014  TIME: 12:50 PM  PATIENT NAME:  Clayton Odonnell  AGE: 80 y.o.  PRE-OPERATIVE DIAGNOSIS:  closed left hip fracture  POST-OPERATIVE DIAGNOSIS:  SAME  PROCEDURE:  INTRAMEDULLARY (IM) NAIL FEMORAL  SURGEON:  Neoma Uhrich, D  ASSISTANT:  none.   OPERATIVE IMPLANTS: Stryker Gamma Nail with distal interlock screw  PREOPERATIVE INDICATIONS:  Clayton Odonnell is a 80 y.o. year old who fell and suffered a hip fracture. He was brought into the ER and then admitted and optimized and then elected for surgical intervention.    The risks benefits and alternatives were discussed with the patient including but not limited to the risks of nonoperative treatment, versus surgical intervention including infection, bleeding, nerve injury, malunion, nonunion, hardware prominence, hardware failure, need for hardware removal, blood clots, cardiopulmonary complications, morbidity, mortality, among others, and they were willing to proceed.    OPERATIVE PROCEDURE:  The patient was brought to the operating room and placed in the supine position. General anesthesia was administered, with a foley. He was placed on the fracture table.  Closed reduction was performed under C-arm guidance. The length of the femur was also measured using fluoroscopy. Time out was then performed after sterile prep and drape. He received preoperative antibiotics.  Incision was made proximal to the greater trochanter. A guidewire was placed in the appropriate position. Confirmation was made on AP and lateral views. The above-named nail was opened. I opened the proximal femur with a reamer. I then placed the nail by hand easily down. I did not need to ream the femur.  Once the nail was completely seated, I placed a guidepin into the femoral head into the center center position. I measured the length, and then reamed the lateral cortex and up into the head. I then placed the lag screw. Slight  compression was applied. Anatomic fixation achieved. Bone quality was mediocre.  I then secured the proximal interlocking bolt, and took off a half a turn, and then removed the instruments, and took final C-arm pictures AP and lateral the entire length of the leg.   Anatomic reconstruction was achieved, and the wounds were irrigated copiously and closed with Vicryl followed by staples and sterile gauze for the skin. The patient was awakened and returned to PACU in stable and satisfactory condition. There no complications and the patient tolerated the procedure well.  He will be weightbearing as tolerated, and will be on ASA 325  for a period of four weeks after discharge.   Edmonia Lynch, M.D.    This note was generated using a template and dragon dictation system. In light of that, I have reviewed the note and all aspects of it are applicable to this case. Any dictation errors are due to the computerized dictation system.

## 2014-03-14 NOTE — Discharge Instructions (Signed)
Bear weight as tolerated  Take ASA 325 daily for 30 days  Keep dressings intact until follow up

## 2014-03-14 NOTE — Progress Notes (Signed)
Patient ID: Clayton Odonnell  male  EXH:[id number]    DOB: [birth year]    DOA: 03/13/2014  PCP: Donnajean Lopes, MD  Assessment/Plan: Principal Problem:   Closed left hip fracture - Orthopedics consulted, n.p.o. after midnight, patient to undergo surgery today - DVT prophylaxis per orthopedics  Active Problems:   Essential hypertension, benign - Currently stable, will restart oral antihypertensives after surgery    Patient is Jehovah's Witness -Continue to monitor closely, currently hemoglobin stable     Other and unspecified hyperlipidemia - Restart statin after surgery  DVT Prophylaxis:  Code Status:Partial code  Family Communication:  Disposition:  Consultants:  Orthopedics  Procedures:  None  Antibiotics:  None  Subjective: Patient seen and examined, pain is controlled, awaiting surgery at the time of encounter  Objective: Weight change:   Intake/Output Summary (Last 24 hours) at 03/14/14 1120 Last data filed at 03/14/14 1030  Gross per 24 hour  Intake      0 ml  Output    350 ml  Net   -350 ml   Blood pressure 119/46, pulse 61, temperature 99.5 F (37.5 C), temperature source Oral, resp. rate 16, height 5\' 10"  (1.778 m), SpO2 91.00%.  Physical Exam: General: Alert and awake, oriented x3, not in any acute distress. CVS: S1-S2 clear, no murmur rubs or gallops Chest: clear to auscultation bilaterally, no wheezing, rales or rhonchi Abdomen: soft nontender, nondistended, normal bowel sounds  Extremities: no cyanosis, clubbing or edema noted bilaterally   Lab Results: Basic Metabolic Panel:  Recent Labs Lab 03/13/14 1734 03/14/14 0502  NA 134* 127*  K 3.8 3.7  CL 97 90*  CO2 23 25  GLUCOSE 119* 114*  BUN 15 10  CREATININE 0.83 0.80  CALCIUM 9.0 8.6   Liver Function Tests:  Recent Labs Lab 03/14/14 0502  ALBUMIN 3.7   No results found for this basename: LIPASE, AMYLASE,  in the last 168 hours No results found for this  basename: AMMONIA,  in the last 168 hours CBC:  Recent Labs Lab 03/13/14 1734 03/14/14 0502  WBC 11.4* 9.5  NEUTROABS 9.9*  --   HGB 12.6* 12.7*  HCT 36.7* 36.7*  MCV 88.6 88.2  PLT 221 209   Cardiac Enzymes: No results found for this basename: CKTOTAL, CKMB, CKMBINDEX, TROPONINI,  in the last 168 hours BNP: No components found with this basename: POCBNP,  CBG: No results found for this basename: GLUCAP,  in the last 168 hours   Micro Results: Recent Results (from the past 240 hour(s))  SURGICAL PCR SCREEN     Status: None   Collection Time    03/14/14  1:49 AM      Result Value Ref Range Status   MRSA, PCR NEGATIVE  NEGATIVE Final   Staphylococcus aureus NEGATIVE  NEGATIVE Final   Comment:            The Xpert SA Assay (FDA     approved for NASAL specimens     in patients over 108 years of age),     is one component of     a comprehensive surveillance     program.  Test performance has     been validated by [employer] for patients greater     than or equal to 35 year old.     It is not intended     to diagnose infection nor to     guide or monitor treatment.    Studies/Results:  Dg Hip Complete Left  03/13/2014   CLINICAL DATA:  Fall, left hip pain  EXAM: LEFT HIP - COMPLETE 2+ VIEW  COMPARISON:  None.  FINDINGS: Nondisplaced transcervical left hip fracture.  Mild degenerative changes of the bilateral hips.  Visualized bony pelvis appears intact.  Degenerative changes of the lower lumbar spine.  IMPRESSION: Nondisplaced transcervical left hip fracture.   Electronically Signed   By: Julian Hy M.D.   On: 03/13/2014 16:44   Ct Head Wo Contrast  03/13/2014   CLINICAL DATA:  [place] and hit head, nausea, patient on aspirin  EXAM: CT HEAD WITHOUT CONTRAST  TECHNIQUE: Contiguous axial images were obtained from the base of the skull through the vertex without intravenous contrast.  COMPARISON:  None.  FINDINGS: There is moderate diffuse cortical atrophy. There is  mild white matter low attenuation bilaterally. There is no evidence of hemorrhage, extra-axial fluid, or vascular territory infarct. No hydrocephalus or mass. No skull fracture.  IMPRESSION: No acute findings.  Age-related involutional changes noted.   Electronically Signed   By: Skipper Cliche M.D.   On: 03/13/2014 21:47   Dg Chest Portable 1 View  03/13/2014   CLINICAL DATA:  Status post fall, hip pain, cough  EXAM: PORTABLE CHEST - 1 VIEW  COMPARISON:  None.  FINDINGS: The heart size and mediastinal contours are within normal limits. Both lungs are clear. The visualized skeletal structures are unremarkable.  IMPRESSION: No active disease.   Electronically Signed   By: Kathreen Devoid   On: 03/13/2014 17:07    Medications: Scheduled Meds: . [facility] HOLD] docusate sodium  100 mg Oral BID  . [MAR HOLD] Influenza vac split quadrivalent PF  0.5 mL Intramuscular Tomorrow-1000  . [facility] HOLD] metoprolol  2.5 mg Intravenous 4 times per day  . [facility] HOLD] pneumococcal 23 valent vaccine  0.5 mL Intramuscular Tomorrow-1000  . [MAR HOLD] Tdap  0.5 mL Intramuscular Once      LOS: 1 day   Adaria Hole M.D. [facility] 03/14/2014, 11:20 AM Pager: [phone]  If 7PM-7AM, please contact night-coverage [email or web address] Password [email or web address]  **Disclaimer: This note was dictated with voice recognition software. Similar sounding words can inadvertently be transcribed and this note may contain transcription errors which may not have been corrected upon publication of note.**

## 2014-03-14 NOTE — Anesthesia Preprocedure Evaluation (Addendum)
Anesthesia Evaluation  Patient identified by MRN, date of birth, ID band Patient awake    Reviewed: Allergy & Precautions, H&P , NPO status , Patient's Chart, lab work & pertinent test results  History of Anesthesia Complications Negative for: history of anesthetic complications  Airway Mallampati: I TM Distance: >3 FB Neck ROM: Full    Dental  (+) Teeth Intact, Dental Advisory Given   Pulmonary former smoker (quit 1954),  breath sounds clear to auscultation  Pulmonary exam normal       Cardiovascular hypertension, Pt. on medications - anginaRhythm:Regular Rate:Normal     Neuro/Psych negative neurological ROS     GI/Hepatic negative GI ROS, (+)     substance abuse  alcohol use,   Endo/Other  negative endocrine ROS  Renal/GU negative Renal ROS     Musculoskeletal   Abdominal   Peds  Hematology Jehovah's witness, no blood component therapy Does accept albumin   Anesthesia Other Findings   Reproductive/Obstetrics                       Anesthesia Physical Anesthesia Plan  ASA: II  Anesthesia Plan: General   Post-op Pain Management:    Induction: Intravenous  Airway Management Planned: Oral ETT  Additional Equipment:   Intra-op Plan:   Post-operative Plan: Extubation in OR  Informed Consent: I have reviewed the patients History and Physical, chart, labs and discussed the procedure including the risks, benefits and alternatives for the proposed anesthesia with the patient or authorized representative who has indicated his/her understanding and acceptance.   Dental advisory given  Plan Discussed with: CRNA and Surgeon  Anesthesia Plan Comments: (Plan routine monitors, GETA)        Anesthesia Quick Evaluation

## 2014-03-14 NOTE — Anesthesia Postprocedure Evaluation (Signed)
  Anesthesia Post-op Note  Patient: Clayton Odonnell  Procedure(s) Performed: Procedure(s): INTRAMEDULLARY (IM) NAIL FEMORAL (Left)  Patient Location: PACU  Anesthesia Type:General  Level of Consciousness: awake, alert , oriented and patient cooperative  Airway and Oxygen Therapy: Patient Spontanous Breathing  Post-op Pain: mild  Post-op Assessment: Post-op Vital signs reviewed, Patient's Cardiovascular Status Stable, Respiratory Function Stable, Patent Airway, No signs of Nausea or vomiting and Pain level controlled  Post-op Vital Signs: Reviewed and stable  Last Vitals:  Filed Vitals:   03/14/14 1345  BP:   Pulse: 69  Temp:   Resp: 13    Complications: No apparent anesthesia complications

## 2014-03-14 NOTE — Anesthesia Procedure Notes (Signed)
Procedure Name: Intubation Date/Time: 03/14/2014 11:39 AM Performed by: Julian Reil Pre-anesthesia Checklist: Patient identified, Emergency Drugs available, Suction available and Patient being monitored Patient Re-evaluated:Patient Re-evaluated prior to inductionOxygen Delivery Method: Circle system utilized Preoxygenation: Pre-oxygenation with 100% oxygen Intubation Type: IV induction Ventilation: Mask ventilation without difficulty Laryngoscope Size: Mac and 3 Grade View: Grade II Tube type: Oral Tube size: 7.5 mm Number of attempts: 1 Airway Equipment and Method: Stylet Placement Confirmation: ETT inserted through vocal cords under direct vision,  positive ETCO2 and breath sounds checked- equal and bilateral Secured at: 23 cm Tube secured with: Tape Dental Injury: Teeth and Oropharynx as per pre-operative assessment

## 2014-03-14 NOTE — Transfer of Care (Signed)
Immediate Anesthesia Transfer of Care Note  Patient: Clayton Odonnell  Procedure(s) Performed: Procedure(s): INTRAMEDULLARY (IM) NAIL FEMORAL (Left)  Patient Location: PACU  Anesthesia Type:General  Level of Consciousness: awake, alert , oriented and patient cooperative  Airway & Oxygen Therapy: Patient Spontanous Breathing and Patient connected to nasal cannula oxygen  Post-op Assessment: Report given to PACU RN, Post -op Vital signs reviewed and stable and Patient moving all extremities  Post vital signs: Reviewed and stable  Complications: No apparent anesthesia complications

## 2014-03-15 LAB — CBC
HEMATOCRIT: 35.6 % — AB (ref 39.0–52.0)
HEMOGLOBIN: 12.4 g/dL — AB (ref 13.0–17.0)
MCH: 30.5 pg (ref 26.0–34.0)
MCHC: 34.8 g/dL (ref 30.0–36.0)
MCV: 87.5 fL (ref 78.0–100.0)
PLATELETS: 223 10*3/uL (ref 150–400)
RBC: 4.07 MIL/uL — AB (ref 4.22–5.81)
RDW: 12.6 % (ref 11.5–15.5)
WBC: 14.3 10*3/uL — AB (ref 4.0–10.5)

## 2014-03-15 LAB — BASIC METABOLIC PANEL
Anion gap: 12 (ref 5–15)
BUN: 12 mg/dL (ref 6–23)
CHLORIDE: 93 meq/L — AB (ref 96–112)
CO2: 25 meq/L (ref 19–32)
Calcium: 9 mg/dL (ref 8.4–10.5)
Creatinine, Ser: 0.76 mg/dL (ref 0.50–1.35)
GFR calc Af Amer: >90 mL/min (ref >90)
GFR, EST NON AFRICAN AMERICAN: 83 mL/min — AB (ref >90)
GLUCOSE: 173 mg/dL — AB (ref 70–99)
POTASSIUM: 3.7 meq/L (ref 3.7–5.3)
Sodium: 130 mEq/L — ABNORMAL LOW (ref 137–147)

## 2014-03-15 LAB — URINE CULTURE
Colony Count: NO GROWTH
Culture: NO GROWTH

## 2014-03-15 LAB — VITAMIN D 25 HYDROXY (VIT D DEFICIENCY, FRACTURES): Vit D, 25-Hydroxy: 36 ng/mL (ref 30–89)

## 2014-03-15 MED ORDER — SIMVASTATIN 5 MG PO TABS
5.0000 mg | ORAL_TABLET | Freq: Every day | ORAL | Status: DC
  Administered 2014-03-15 – 2014-03-16 (×2): 5 mg via ORAL
  Filled 2014-03-15 (×3): qty 1

## 2014-03-15 NOTE — Progress Notes (Signed)
Occupational Therapy Evaluation Patient Details Name: Clayton Odonnell MRN: [id number] DOB: 11/30/1930 Today's Date: 03/15/2014    History of Present Illness Clayton Odonnell is an 80 y.o. male s/p IM nail for Lt hip fx on 03/14/14.    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 He was independent with ADLs. Pt requires assist for LB dressing and for functional mobility at this time due to decreased strength and limited ROM. He would be an excellent candidate for CIR and has strong support through his children. Pt will benefit from acute OT to address LB dressing and functional mobility.     Follow Up Recommendations  CIR;Supervision/Assistance - 24 hour    Equipment Recommendations  None recommended by OT    Recommendations for Other Services       Precautions / Restrictions Precautions Precautions: Fall Restrictions Weight Bearing Restrictions: Yes LLE Weight Bearing: Weight bearing as tolerated      Mobility Bed Mobility               General bed mobility comments: Not addressed; pt sitting up in chair before/after OT session  Transfers Overall transfer level: Needs assistance Equipment used: Rolling walker (2 wheeled) Transfers: Sit to/from [facility] Sit to Stand: Min assist Stand pivot transfers: Min assist       General transfer comment: Min (A) to power up to full standing.         ADL Overall ADL's : Needs assistance/impaired Eating/Feeding: Independent;Sitting   Grooming: Set up;Sitting   Upper Body Bathing: Set up;Sitting   Lower Body Bathing: Minimal assistance;Sit to/from stand   Upper Body Dressing : Set up;Sitting   Lower Body Dressing: Moderate assistance;Sit to/from stand   Toilet Transfer: Stand-pivot;RW;Minimal assistance                   Vision  Pt reports no change from baseline.                    Perception Perception Perception Tested?:  No   Praxis Praxis Praxis tested?: Within functional limits    Pertinent Vitals/Pain Pain Assessment: No/denies pain     Hand Dominance Right   Extremity/Trunk Assessment Upper Extremity Assessment Upper Extremity Assessment: Overall WFL for tasks assessed   Lower Extremity Assessment Lower Extremity Assessment: Defer to PT evaluation   Cervical / Trunk Assessment Cervical / Trunk Assessment: Normal   Communication Communication Communication: No difficulties   Cognition Arousal/Alertness: Awake/alert Behavior During Therapy: WFL for tasks assessed/performed Overall Cognitive Status: Within Functional Limits for tasks assessed                                Home Living Family/patient expects to be discharged to:: Private residence Living Arrangements: Spouse/significant other Available Help at Discharge: Family;Available 24 hours/day (has several children who can assist)               Bathroom Shower/Tub: Walk-in shower         Home Equipment: Shower seat - built in;Grab bars - tub/shower;Grab bars - toilet;Other (comment) (toilet riser)   Additional Comments: Pt is primary caregiver for his wife, who requires Supervision and minimal physical assistance.       Prior Functioning/Environment Level of Independence: Independent             OT Diagnosis: Generalized weakness;Acute pain   OT  Problem List: Decreased strength;Decreased range of motion;Decreased activity tolerance;Decreased safety awareness;Decreased knowledge of use of DME or AE;Decreased knowledge of precautions;Pain   OT Treatment/Interventions: Self-care/ADL training;Therapeutic exercise;Energy conservation;DME and/or AE instruction;Therapeutic activities;Patient/family education;Balance training    OT Goals(Current goals can be found in the care plan section) Acute Rehab OT Goals Patient Stated Goal: to get home to my wife OT Goal Formulation: With patient Time For Goal  Achievement: 03/29/14 Potential to Achieve Goals: Good  OT Frequency: Min 2X/week              End of Session Equipment Utilized During Treatment: Rolling walker  Activity Tolerance: Patient tolerated treatment well Patient left: in chair;with call bell/phone within reach   Time: 1223-1240 OT Time Calculation (min): 17 min Charges:  OT General Charges $OT Visit: 1 Procedure OT Evaluation $Initial OT Evaluation Tier I: 1 Procedure OT Treatments $Self Care/Home Management : 8-22 mins  Villa Herb M 03/15/2014, 1:31 PM  Cyndie Chime, OTR/L Occupational Therapist [phone]

## 2014-03-15 NOTE — Progress Notes (Signed)
Rehab Admissions Coordinator Note:  Patient was screened by Arwin Bisceglia L for appropriateness for an Inpatient Acute Rehab Consult.  At this time, we are recommending Inpatient Rehab consult.  Georgiann Neider L 03/15/2014, 4:02 PM  I can be reached at [phone].

## 2014-03-15 NOTE — Progress Notes (Signed)
Patient ID: Clayton Odonnell  male  HEN:[id number]    DOB: [birth year]    DOA: 03/13/2014  PCP: Donnajean Lopes, MD  Assessment/Plan: Principal Problem:   Closed left hip fracture: Postop day 1 - Orthopedics was consulted and patient underwent intramedullary femoral nail surgery on 9/22 - Tolerating diet, pain control, start PT today - DVT prophylaxis per orthopedics  Active Problems:   Essential hypertension, benign - Currently stable    Patient is Jehovah's Witness -Continue to monitor closely, currently hemoglobin stable     Other and unspecified hyperlipidemia - Restarted statin  Hyponatremia - Likely due to HCTZ, recheck BMET, CBC  DVT Prophylaxis:  Code Status:Partial code  Family Communication:  Disposition: The patient prefers DC home, states he has good family support, does not go to any outpatient skilled nursing facility for rehabilitation.   Consultants:  Orthopedics  Procedures:  Left intramedullary femoral nailing  Antibiotics:  None  Subjective: Patient seen and examined, eating breakfast, denies any specific complaints, states feeling better  Objective: Weight change:   Intake/Output Summary (Last 24 hours) at 03/15/14 1128 Last data filed at 03/15/14 0900  Gross per 24 hour  Intake   1440 ml  Output   1980 ml  Net   -540 ml   Blood pressure 128/55, pulse 63, temperature 98 F (36.7 C), temperature source Oral, resp. rate 20, height 5\' 10"  (1.778 m), SpO2 99.00%.  Physical Exam: General: Alert and awake, oriented x3, NAD CVS: S1-S2 clear, no murmur rubs or gallops Chest: clear to auscultation bilaterally, no wheezing, rales or rhonchi Abdomen: soft nontender, nondistended, normal bowel sounds  Extremities: no cyanosis, clubbing or edema noted bilaterally   Lab Results: Basic Metabolic Panel:  Recent Labs Lab 03/13/14 1734 03/14/14 0502  NA 134* 127*  K 3.8 3.7  CL 97 90*  CO2 23 25  GLUCOSE 119* 114*  BUN 15 10    CREATININE 0.83 0.80  CALCIUM 9.0 8.6   Liver Function Tests:  Recent Labs Lab 03/14/14 0502  ALBUMIN 3.7   No results found for this basename: LIPASE, AMYLASE,  in the last 168 hours No results found for this basename: AMMONIA,  in the last 168 hours CBC:  Recent Labs Lab 03/13/14 1734 03/14/14 0502  WBC 11.4* 9.5  NEUTROABS 9.9*  --   HGB 12.6* 12.7*  HCT 36.7* 36.7*  MCV 88.6 88.2  PLT 221 209   Cardiac Enzymes: No results found for this basename: CKTOTAL, CKMB, CKMBINDEX, TROPONINI,  in the last 168 hours BNP: No components found with this basename: POCBNP,  CBG: No results found for this basename: GLUCAP,  in the last 168 hours   Micro Results: Recent Results (from the past 240 hour(s))  SURGICAL PCR SCREEN     Status: None   Collection Time    03/14/14  1:49 AM      Result Value Ref Range Status   MRSA, PCR NEGATIVE  NEGATIVE Final   Staphylococcus aureus NEGATIVE  NEGATIVE Final   Comment:            The Xpert SA Assay (FDA     approved for NASAL specimens     in patients over 44 years of age),     is one component of     a comprehensive surveillance     program.  Test performance has     been validated by [employer] for patients greater     than or equal  to 40 year old.     It is not intended     to diagnose infection nor to     guide or monitor treatment.  URINE CULTURE     Status: None   Collection Time    03/14/14  4:59 AM      Result Value Ref Range Status   Specimen Description URINE, CATHETERIZED   Final   Special Requests NONE   Final   Culture  Setup Time     Final   Value: 03/14/2014 09:39     Performed at [facility]     Final   Value: NO GROWTH     Performed at [facility]   Culture     Final   Value: NO GROWTH     Performed at [facility]   Report Status 03/15/2014 FINAL   Final    Studies/Results: Dg Hip Complete Left  03/13/2014   CLINICAL DATA:  Fall, left hip pain  EXAM:  LEFT HIP - COMPLETE 2+ VIEW  COMPARISON:  None.  FINDINGS: Nondisplaced transcervical left hip fracture.  Mild degenerative changes of the bilateral hips.  Visualized bony pelvis appears intact.  Degenerative changes of the lower lumbar spine.  IMPRESSION: Nondisplaced transcervical left hip fracture.   Electronically Signed   By: Julian Hy M.D.   On: 03/13/2014 16:44   Ct Head Wo Contrast  03/13/2014   CLINICAL DATA:  [place] and hit head, nausea, patient on aspirin  EXAM: CT HEAD WITHOUT CONTRAST  TECHNIQUE: Contiguous axial images were obtained from the base of the skull through the vertex without intravenous contrast.  COMPARISON:  None.  FINDINGS: There is moderate diffuse cortical atrophy. There is mild white matter low attenuation bilaterally. There is no evidence of hemorrhage, extra-axial fluid, or vascular territory infarct. No hydrocephalus or mass. No skull fracture.  IMPRESSION: No acute findings.  Age-related involutional changes noted.   Electronically Signed   By: Skipper Cliche M.D.   On: 03/13/2014 21:47   Dg Chest Portable 1 View  03/13/2014   CLINICAL DATA:  Status post fall, hip pain, cough  EXAM: PORTABLE CHEST - 1 VIEW  COMPARISON:  None.  FINDINGS: The heart size and mediastinal contours are within normal limits. Both lungs are clear. The visualized skeletal structures are unremarkable.  IMPRESSION: No active disease.   Electronically Signed   By: Kathreen Devoid   On: 03/13/2014 17:07    Medications: Scheduled Meds: . aspirin EC  325 mg Oral Q breakfast  . docusate sodium  100 mg Oral BID  . fluticasone  1 spray Each Nare Daily  . Influenza vac split quadrivalent PF  0.5 mL Intramuscular Tomorrow-1000  . pneumococcal 23 valent vaccine  0.5 mL Intramuscular Tomorrow-1000  . Tdap  0.5 mL Intramuscular Once      LOS: 2 days   Irelyn Perfecto M.D. [facility] 03/15/2014, 11:28 AM Pager: [phone]  If 7PM-7AM, please contact night-coverage [email or web address] Password  [email or web address]  **Disclaimer: This note was dictated with voice recognition software. Similar sounding words can inadvertently be transcribed and this note may contain transcription errors which may not have been corrected upon publication of note.**

## 2014-03-15 NOTE — Progress Notes (Signed)
Patient ID: Clayton Odonnell, male   DOB: 02-13-1931, 80 y.o.   MRN: [id number]     Subjective:  Patient reports pain as mild.  Patient sitting up in bed and in no acute distress.  Objective:   VITALS:   Filed Vitals:   03/14/14 2000 03/14/14 2005 03/14/14 2349 03/15/14 0600  BP:  106/47 116/56 128/55  Pulse:  78 73 63  Temp:  98.4 F (36.9 C) 97.6 F (36.4 C) 98 F (36.7 C)  TempSrc:  Oral Oral Oral  Resp: 18 14 16 20   Height:      SpO2: 98% 98% 98% 99%    ABD soft Sensation intact distally Dorsiflexion/Plantar flexion intact Incision: dressing C/D/I and no drainage   Lab Results  Component Value Date   WBC 9.5 03/14/2014   HGB 12.7* 03/14/2014   HCT 36.7* 03/14/2014   MCV 88.2 03/14/2014   PLT 209 03/14/2014     Assessment/Plan: 1 Day Post-Op   Principal Problem:   Closed left hip fracture Active Problems:   Essential hypertension, benign   Patient is Jehovah's Witness   Other and unspecified hyperlipidemia   Advance diet Up with therapy WBAT Dry dressing PRN     Remonia Richter 03/15/2014, 8:24 AM   Edmonia Lynch MD [phone]

## 2014-03-15 NOTE — Evaluation (Signed)
Physical Therapy Evaluation Patient Details Name: Clayton Odonnell MRN: [id number] DOB: [birth year] Today's Date: 03/15/2014   History of Present Illness  Clayton Lightner. Odonnell is an 80 y.o. male s/p IM nail for Lt hip fx on 03/14/14.   Clinical Impression  Pt very motivated and wants to return to independence.  At this time pt mobility limited by weakness and fatigue.  Pt would be a great candidate for CIR to maximize independence and decrease burden of care prior to returning to home with his children to A him and his wife.  Will continue to follow.      Follow Up Recommendations CIR    Equipment Recommendations  Rolling walker with 5" wheels    Recommendations for Other Services Rehab consult     Precautions / Restrictions Precautions Precautions: Fall Restrictions Weight Bearing Restrictions: Yes LLE Weight Bearing: Weight bearing as tolerated      Mobility  Bed Mobility Overal bed mobility: Needs Assistance Bed Mobility: Supine to Sit     Supine to sit: Min assist     General bed mobility comments: A with L LE only.  cues for sequencing and safe technique.    Transfers Overall transfer level: Needs assistance Equipment used: Rolling walker (2 wheeled) Transfers: Sit to/from Stand Sit to Stand: Mod assist Stand pivot transfers: Min assist       General transfer comment: ModA to stand from bed with cueing for safe technique.    Ambulation/Gait Ambulation/Gait assistance: Min assist Ambulation Distance (Feet): 5 Feet Assistive device: Rolling walker (2 wheeled) Gait Pattern/deviations: Step-to pattern;Decreased step length - right;Decreased stance time - left     General Gait Details: pt moves slowly and relies heavily on RW for support.  pt indicates L LE fatiges quickly, but still only "sore" not painful.    Stairs            Wheelchair Mobility    Modified Rankin (Stroke Patients Only)       Balance Overall balance assessment: No  apparent balance deficits (not formally assessed)                                           Pertinent Vitals/Pain Pain Assessment: No/denies pain (pt describes "soreness", but tells PT "it's not pain".)    Home Living Family/patient expects to be discharged to:: Inpatient rehab Living Arrangements: Spouse/significant other Available Help at Discharge: Family;Available 24 hours/day           Home Equipment: Shower seat - built in;Grab bars - tub/shower;Grab bars - toilet;Toilet riser Additional Comments: Pt is primary caregiver for his wife, who requires Supervision and minimal physical assistance, but states children can provide 24hr care for pt and his wife after D/C.      Prior Function Level of Independence: Independent               Hand Dominance   Dominant Hand: Right    Extremity/Trunk Assessment   Upper Extremity Assessment: Defer to OT evaluation           Lower Extremity Assessment: LLE deficits/detail   LLE Deficits / Details: Strength limited post-op.    Cervical / Trunk Assessment: Normal  Communication   Communication: No difficulties  Cognition Arousal/Alertness: Awake/alert Behavior During Therapy: WFL for tasks assessed/performed Overall Cognitive Status: Within Functional Limits for tasks assessed  General Comments      Exercises        Assessment/Plan    PT Assessment Patient needs continued PT services  PT Diagnosis Difficulty walking   PT Problem List Decreased strength;Decreased activity tolerance;Decreased balance;Decreased mobility;Decreased coordination;Decreased knowledge of use of DME  PT Treatment Interventions DME instruction;Gait training;Stair training;Functional mobility training;Therapeutic activities;Therapeutic exercise;Balance training;Patient/family education   PT Goals (Current goals can be found in the Care Plan section) Acute Rehab PT Goals Patient Stated Goal: to  get home to my wife PT Goal Formulation: With patient Time For Goal Achievement: 03/29/14 Potential to Achieve Goals: Good    Frequency Min 3X/week   Barriers to discharge        Co-evaluation               End of Session Equipment Utilized During Treatment: Gait belt Activity Tolerance: Patient tolerated treatment well Patient left: in chair;with call bell/phone within reach Nurse Communication: Mobility status         Time: 0919-0950 PT Time Calculation (min): 31 min   Charges:   PT Evaluation $Initial PT Evaluation Tier I: 1 Procedure PT Treatments $Gait Training: 8-22 mins $Therapeutic Activity: 8-22 mins   PT G CodesCatarina Odonnell, [place] 03/15/2014, 2:51 PM

## 2014-03-15 NOTE — Clinical Documentation Improvement (Signed)
Patient with fractured hip after fall; IM nailing performed.   Patient with abnormal lab value: Na+ = 127  LR infusion initiated  Please provide a diagnosis associated with the above clinical data and treatment provided and document findings in next progress note and include in discharge summary if applicable.  Thank You, Zoila Shutter ,RN Clinical Documentation Specialist:  [facility] [employer]

## 2014-03-15 NOTE — Progress Notes (Signed)
I participated in the care of this patient and agree with the above history, physical and evaluation. I performed a review of the history and a physical exam as detailed   Puneet Masoner Daniel Kia Varnadore MD  

## 2014-03-16 DIAGNOSIS — W19XXXA Unspecified fall, initial encounter: Secondary | ICD-10-CM

## 2014-03-16 DIAGNOSIS — S72033A Displaced midcervical fracture of unspecified femur, initial encounter for closed fracture: Secondary | ICD-10-CM | POA: Diagnosis not present

## 2014-03-16 DIAGNOSIS — S72009A Fracture of unspecified part of neck of unspecified femur, initial encounter for closed fracture: Secondary | ICD-10-CM

## 2014-03-16 LAB — BASIC METABOLIC PANEL
Anion gap: 9 (ref 5–15)
BUN: 10 mg/dL (ref 6–23)
CALCIUM: 9.1 mg/dL (ref 8.4–10.5)
CHLORIDE: 95 meq/L — AB (ref 96–112)
CO2: 28 mEq/L (ref 19–32)
CREATININE: 0.7 mg/dL (ref 0.50–1.35)
GFR calc non Af Amer: 86 mL/min — ABNORMAL LOW (ref >90)
Glucose, Bld: 114 mg/dL — ABNORMAL HIGH (ref 70–99)
Potassium: 3.9 mEq/L (ref 3.7–5.3)
Sodium: 132 mEq/L — ABNORMAL LOW (ref 137–147)

## 2014-03-16 LAB — CBC
HCT: 34.6 % — ABNORMAL LOW (ref 39.0–52.0)
Hemoglobin: 12.3 g/dL — ABNORMAL LOW (ref 13.0–17.0)
MCH: 31.1 pg (ref 26.0–34.0)
MCHC: 35.5 g/dL (ref 30.0–36.0)
MCV: 87.6 fL (ref 78.0–100.0)
Platelets: 213 10*3/uL (ref 150–400)
RBC: 3.95 MIL/uL — ABNORMAL LOW (ref 4.22–5.81)
RDW: 12.7 % (ref 11.5–15.5)
WBC: 9.7 10*3/uL (ref 4.0–10.5)

## 2014-03-16 MED ORDER — POLYETHYLENE GLYCOL 3350 17 G PO PACK
17.0000 g | PACK | Freq: Every day | ORAL | Status: DC
  Administered 2014-03-16: 17 g via ORAL
  Filled 2014-03-16 (×2): qty 1

## 2014-03-16 NOTE — Progress Notes (Signed)
I met with pt and his daughter at bedside. Pt has already been up with Nursing to bathroom and to bathe. I will begin [facility] insurance authorization for a possible inpt rehab admission for a few days vs direct d/c home with [facility] or OP therapy. His wife just d/c'd from inpt rehab on Monday and his children are providing her 24/7 care. He is hopeful for d/c asap. I will follow up tomorrow on his progress. Discussed with RN CM. [phone]

## 2014-03-16 NOTE — Progress Notes (Signed)
Physical Therapy Treatment Patient Details Name: CLARANCE BOLLARD MRN: [id number] DOB: May 12, 1931 Today's Date: 03/16/2014    History of Present Illness Iliya Spivack. Platten is an 80 y.o. male s/p IM nail for Lt hip fx on 03/14/14.     PT Comments    Patient making good progress with ambulation this session. Patient stated his confidence has improved but he still anxious about increasing weight through LLE. Family and patient continue to request CIR to decrease burden of care once home.  Follow Up Recommendations  CIR     Equipment Recommendations  Rolling walker with 5" wheels    Recommendations for Other Services       Precautions / Restrictions Precautions Precautions: Fall Restrictions LLE Weight Bearing: Weight bearing as tolerated    Mobility  Bed Mobility               General bed mobility comments: Patient up in recliner before and after session  Transfers Overall transfer level: Needs assistance Equipment used: Rolling walker (2 wheeled)   Sit to Stand: Min assist         General transfer comment: Min A to ensure balance. Cues for hand placement  Ambulation/Gait Ambulation/Gait assistance: Min guard Ambulation Distance (Feet): 120 Feet Assistive device: Rolling walker (2 wheeled) Gait Pattern/deviations: Step-to pattern   Gait velocity interpretation: Below normal speed for age/gender General Gait Details: Cues for upright posture otherwise safe technique and use of RW   Stairs            Wheelchair Mobility    Modified Rankin (Stroke Patients Only)       Balance                                    Cognition Arousal/Alertness: Awake/alert Behavior During Therapy: WFL for tasks assessed/performed Overall Cognitive Status: Within Functional Limits for tasks assessed                      Exercises General Exercises - Lower Extremity Quad Sets: AROM;Left;10 reps Long Arc Quad: AROM;Left;10 reps Heel  Slides: AAROM;Left;10 reps Hip ABduction/ADduction: AAROM;Left;10 reps    General Comments        Pertinent Vitals/Pain Pain Assessment: 0-10 Pain Score: 3  Pain Location: L hip Pain Descriptors / Indicators: Aching;Sore Pain Intervention(s): Monitored during session    Home Living                      Prior Function            PT Goals (current goals can now be found in the care plan section) Progress towards PT goals: Progressing toward goals    Frequency  Min 3X/week    PT Plan Current plan remains appropriate    Co-evaluation             End of Session Equipment Utilized During Treatment: Gait belt Activity Tolerance: Patient tolerated treatment well Patient left: in chair;with call bell/phone within reach;with family/visitor present     Time: 4098-1191 PT Time Calculation (min): 26 min  Charges:  $Gait Training: 8-22 mins $Therapeutic Exercise: 8-22 mins                    G Codes:      Jacqualyn Posey 03/16/2014, 12:39 PM 03/16/2014 Jacqualyn Posey PTA [phone] pager [phone] office

## 2014-03-16 NOTE — Progress Notes (Signed)
Patient ID: Clayton Odonnell  male  UTM:[id number]    DOB: 1930/12/13    DOA: 03/13/2014  PCP: Donnajean Lopes, MD  Assessment/Plan: Principal Problem:   Closed left hip fracture: Postop day 2 - Orthopedics was consulted and patient underwent intramedullary femoral nail surgery on 9/22 - Tolerating diet, pain controlled - PT evaluation recommended CIR, consult placed - DVT prophylaxis per orthopedics  Active Problems:   Essential hypertension, benign - Currently stable    Patient is Jehovah's Witness -Continue to monitor closely, currently hemoglobin stable     Other and unspecified hyperlipidemia - Restarted statin  Hyponatremia - Likely due to HCTZ, improving  Leukocytosis: Likely due to stress emargination, no signs or symptoms of any infection, white count 9.7, improved from 14.3 yesterday.  DVT Prophylaxis:  Code Status:Partial code  Family Communication:  Disposition: CIR hopefully tomorrow  Consultants:  Orthopedics  Procedures:  Left intramedullary femoral nailing  Antibiotics:  None  Subjective: Patient seen and examined, denies any specific complaints  Objective: Weight change:   Intake/Output Summary (Last 24 hours) at 03/16/14 1204 Last data filed at 03/16/14 0900  Gross per 24 hour  Intake    960 ml  Output   2050 ml  Net  -1090 ml   Blood pressure 141/57, pulse 79, temperature 98.4 F (36.9 C), temperature source Oral, resp. rate 20, height 5\' 10"  (1.778 m), SpO2 97.00%.  Physical Exam: General: Alert and awake, oriented x3, NAD CVS: S1-S2 clear, no murmur rubs or gallops Chest: clear to auscultation bilaterally, no wheezing, rales or rhonchi Abdomen: soft nontender, nondistended, normal bowel sounds  Extremities: no cyanosis, clubbing or edema noted bilaterally   Lab Results: Basic Metabolic Panel:  Recent Labs Lab 03/15/14 1128 03/16/14 0814  NA 130* 132*  K 3.7 3.9  CL 93* 95*  CO2 25 28  GLUCOSE 173* 114*  BUN  12 10  CREATININE 0.76 0.70  CALCIUM 9.0 9.1   Liver Function Tests:  Recent Labs Lab 03/14/14 0502  ALBUMIN 3.7   No results found for this basename: LIPASE, AMYLASE,  in the last 168 hours No results found for this basename: AMMONIA,  in the last 168 hours CBC:  Recent Labs Lab 03/13/14 1734  03/15/14 1128 03/16/14 0814  WBC 11.4*  < > 14.3* 9.7  NEUTROABS 9.9*  --   --   --   HGB 12.6*  < > 12.4* 12.3*  HCT 36.7*  < > 35.6* 34.6*  MCV 88.6  < > 87.5 87.6  PLT 221  < > 223 213  < > = values in this interval not displayed. Cardiac Enzymes: No results found for this basename: CKTOTAL, CKMB, CKMBINDEX, TROPONINI,  in the last 168 hours BNP: No components found with this basename: POCBNP,  CBG: No results found for this basename: GLUCAP,  in the last 168 hours   Micro Results: Recent Results (from the past 240 hour(s))  SURGICAL PCR SCREEN     Status: None   Collection Time    03/14/14  1:49 AM      Result Value Ref Range Status   MRSA, PCR NEGATIVE  NEGATIVE Final   Staphylococcus aureus NEGATIVE  NEGATIVE Final   Comment:            The Xpert SA Assay (FDA     approved for NASAL specimens     in patients over 49 years of age),     is one component of     a  comprehensive surveillance     program.  Test performance has     been validated by [facility] for patients greater     than or equal to 60 year old.     It is not intended     to diagnose infection nor to     guide or monitor treatment.  URINE CULTURE     Status: None   Collection Time    03/14/14  4:59 AM      Result Value Ref Range Status   Specimen Description URINE, CATHETERIZED   Final   Special Requests NONE   Final   Culture  Setup Time     Final   Value: 03/14/2014 09:39     Performed at [facility]     Final   Value: NO GROWTH     Performed at [facility]   Culture     Final   Value: NO GROWTH     Performed at [facility]   Report Status  03/15/2014 FINAL   Final    Studies/Results: Dg Hip Complete Left  03/13/2014   CLINICAL DATA:  Fall, left hip pain  EXAM: LEFT HIP - COMPLETE 2+ VIEW  COMPARISON:  None.  FINDINGS: Nondisplaced transcervical left hip fracture.  Mild degenerative changes of the bilateral hips.  Visualized bony pelvis appears intact.  Degenerative changes of the lower lumbar spine.  IMPRESSION: Nondisplaced transcervical left hip fracture.   Electronically Signed   By: Julian Hy M.D.   On: 03/13/2014 16:44   Ct Head Wo Contrast  03/13/2014   CLINICAL DATA:  [place] and hit head, nausea, patient on aspirin  EXAM: CT HEAD WITHOUT CONTRAST  TECHNIQUE: Contiguous axial images were obtained from the base of the skull through the vertex without intravenous contrast.  COMPARISON:  None.  FINDINGS: There is moderate diffuse cortical atrophy. There is mild white matter low attenuation bilaterally. There is no evidence of hemorrhage, extra-axial fluid, or vascular territory infarct. No hydrocephalus or mass. No skull fracture.  IMPRESSION: No acute findings.  Age-related involutional changes noted.   Electronically Signed   By: Skipper Cliche M.D.   On: 03/13/2014 21:47   Dg Chest Portable 1 View  03/13/2014   CLINICAL DATA:  Status post fall, hip pain, cough  EXAM: PORTABLE CHEST - 1 VIEW  COMPARISON:  None.  FINDINGS: The heart size and mediastinal contours are within normal limits. Both lungs are clear. The visualized skeletal structures are unremarkable.  IMPRESSION: No active disease.   Electronically Signed   By: Kathreen Devoid   On: 03/13/2014 17:07    Medications: Scheduled Meds: . aspirin EC  325 mg Oral Q breakfast  . docusate sodium  100 mg Oral BID  . fluticasone  1 spray Each Nare Daily  . simvastatin  5 mg Oral q1800  . Tdap  0.5 mL Intramuscular Once      LOS: 3 days   [name] M.D. [facility] 03/16/2014, 12:04 PM Pager: [phone]  If 7PM-7AM, please contact  night-coverage [email or web address] Password [email or web address]  **Disclaimer: This note was dictated with voice recognition software. Similar sounding words can inadvertently be transcribed and this note may contain transcription errors which may not have been corrected upon publication of note.**

## 2014-03-16 NOTE — Progress Notes (Signed)
I participated in the care of this patient and agree with the above history, physical and evaluation. I performed a review of the history and a physical exam as detailed   Cari Burgo Daniel Joachim Carton MD  

## 2014-03-16 NOTE — Progress Notes (Signed)
Patient ID: Clayton Odonnell, male   DOB: January 16, 1931, 80 y.o.   MRN: [id number]     Subjective:  Patient reports pain as mild.  Patient states that he feels better and denies any CP or SOB  Objective:   VITALS:   Filed Vitals:   03/15/14 0600 03/15/14 1100 03/15/14 2039 03/16/14 0525  BP: 128/55 134/60 132/52 141/57  Pulse: 63 72 74 79  Temp: 98 F (36.7 C)  98.1 F (36.7 C) 98.4 F (36.9 C)  TempSrc: Oral  Oral Oral  Resp: 20 20    Height:      SpO2: 99% 95% 97% 97%    ABD soft Sensation intact distally Dorsiflexion/Plantar flexion intact Incision: dressing C/D/I and no drainage   Lab Results  Component Value Date   WBC 14.3* 03/15/2014   HGB 12.4* 03/15/2014   HCT 35.6* 03/15/2014   MCV 87.5 03/15/2014   PLT 223 03/15/2014     Assessment/Plan: 2 Days Post-Op   Principal Problem:   Closed left hip fracture Active Problems:   Essential hypertension, benign   Patient is Jehovah's Witness   Other and unspecified hyperlipidemia   Advance diet Up with therapy Continue plan per medicine WBAT Dry dressing PRN    Clayton Odonnell 03/16/2014, 7:29 AM   Edmonia Lynch MD [phone]

## 2014-03-16 NOTE — Consult Note (Signed)
Physical Medicine and Rehabilitation Consult  Reason for Consult: left hip fracture Referring Physician: Dr. Tana Coast   HPI: Clayton Odonnell is a 80 y.o. male with history of HTN, dyslipidemia; who was admitted on 03/13/14 past fall with inability to ambulate. He was settling wife in (home from [place], tripped over threshold while bringing in the newspaper and hit his head--denied LOC or dizziness. CT head with moderate diffuse cortical atrophy but no acute changes. X rays left hip with Nondisplaced transcervical left hip fracture and he was taken to OR for IM nail L-hip by Dr. Percell Miller. Post op with hyponatremia as well as reactive leucocytosis. Is WBAT and on ASA for DVT prophylaxis. Therapy evaluations done yesterday and CIR recommended by MD and rehab team.     Review of Systems  HENT: Negative for hearing loss.   Eyes: Negative for blurred vision and double vision.  Respiratory: Negative for cough.   Cardiovascular: Negative for chest pain and palpitations.  Gastrointestinal: Negative for heartburn, nausea and abdominal pain.  Musculoskeletal: Positive for joint pain and myalgias.  Neurological: Negative for dizziness, tingling and headaches.  Psychiatric/Behavioral: The patient is not nervous/anxious and does not have insomnia.     Past Medical History  Diagnosis Date  . Hypertension   . Hyperlipemia   . Pneumonia     Past Surgical History  Procedure Laterality Date  . Tonsillectomy      Family History  Problem Relation Age of Onset  . CAD Brother   . Osteoporosis Daughter     Social History:   Married. Family helping provide supervision for wife. He reports that he quit smoking about 61 years ago. His smoking use included Cigarettes. He has never used smokeless tobacco. He reports that he drinks 1-2 beers/day.  He reports that he does not use illicit drugs.   Allergies  Allergen Reactions  . Tetanus Toxoids   . Other   . Penicillins Hives and Rash     Medications Prior to Admission  Medication Sig Dispense Refill  . lovastatin (MEVACOR) 10 MG tablet Take 10 mg by mouth at bedtime.      . triamterene-hydrochlorothiazide (MAXZIDE-25) 37.5-25 MG per tablet Take 1 tablet by mouth daily.        Home: Home Living Family/patient expects to be discharged to:: Inpatient rehab Living Arrangements: Spouse/significant other Available Help at Discharge: Family;Available 24 hours/day Home Equipment: Shower seat - built in;Grab bars - tub/shower;Grab bars - toilet;Toilet riser Additional Comments: Pt is primary caregiver for his wife, who requires Supervision and minimal physical assistance, but states children can provide 24hr care for pt and his wife after D/C.    Functional History: Prior Function Level of Independence: Independent Functional Status:  Mobility: Bed Mobility Overal bed mobility: Needs Assistance Bed Mobility: Supine to Sit Supine to sit: Min assist General bed mobility comments: A with L LE only.  cues for sequencing and safe technique.   Transfers Overall transfer level: Needs assistance Equipment used: Rolling walker (2 wheeled) Transfers: Sit to/from Stand Sit to Stand: Mod assist Stand pivot transfers: Min assist General transfer comment: ModA to stand from bed with cueing for safe technique.   Ambulation/Gait Ambulation/Gait assistance: Min assist Ambulation Distance (Feet): 5 Feet Assistive device: Rolling walker (2 wheeled) Gait Pattern/deviations: Step-to pattern;Decreased step length - right;Decreased stance time - left General Gait Details: pt moves slowly and relies heavily on RW for support.  pt indicates L LE fatiges quickly, but still only "sore" not  painful.      ADL: ADL Overall ADL's : Needs assistance/impaired Eating/Feeding: Independent;Sitting Grooming: Set up;Sitting Upper Body Bathing: Set up;Sitting Lower Body Bathing: Minimal assistance;Sit to/from stand Upper Body Dressing : Set  up;Sitting Lower Body Dressing: Moderate assistance;Sit to/from stand Toilet Transfer: Stand-pivot;RW;Minimal assistance  Cognition: Cognition Overall Cognitive Status: Within Functional Limits for tasks assessed Orientation Level: Oriented X4 Cognition Arousal/Alertness: Awake/alert Behavior During Therapy: WFL for tasks assessed/performed Overall Cognitive Status: Within Functional Limits for tasks assessed  Blood pressure 141/57, pulse 79, temperature 98.4 F (36.9 C), temperature source Oral, resp. rate 20, height 5\' 10"  (1.778 m), SpO2 97.00%. Physical Exam  Nursing note and vitals reviewed. Constitutional: He is oriented to person, place, and time. He appears well-developed and well-nourished.  HENT:  Head: Normocephalic and atraumatic.  Eyes: Conjunctivae are normal. Pupils are equal, round, and reactive to light.  Neck: Normal range of motion. Neck supple. No tracheal deviation present. No thyromegaly present.  Cardiovascular: Normal rate and regular rhythm.   Respiratory: Effort normal and breath sounds normal. No respiratory distress. He has no wheezes.  GI: Soft. Bowel sounds are normal. He exhibits no distension. There is no tenderness.  Musculoskeletal:  Foam dressing left elbow. Left hip with dry dressing. Moves BUE and RLE without difficulty. LLE limited due to pain/recent surgery.   Neurological: He is alert and oriented to person, place, and time.  5/5 UE. LLE limited by pain/fx. Normal movement and sensation in foot. RLE 4/5.   Skin: Skin is warm and dry.  Psychiatric: He has a normal mood and affect. His behavior is normal. Judgment and thought content normal.    Results for orders placed during the hospital encounter of 03/13/14 (from the past 24 hour(s))  BASIC METABOLIC PANEL     Status: Abnormal   Collection Time    03/15/14 11:28 AM      Result Value Ref Range   Sodium 130 (*) 137 - 147 mEq/L   Potassium 3.7  3.7 - 5.3 mEq/L   Chloride 93 (*) 96 - 112  mEq/L   CO2 25  19 - 32 mEq/L   Glucose, Bld 173 (*) 70 - 99 mg/dL   BUN 12  6 - 23 mg/dL   Creatinine, Ser 0.76  0.50 - 1.35 mg/dL   Calcium 9.0  8.4 - 10.5 mg/dL   GFR calc non Af Amer 83 (*) >90 mL/min   GFR calc Af Amer >90  >90 mL/min   Anion gap 12  5 - 15  CBC     Status: Abnormal   Collection Time    03/15/14 11:28 AM      Result Value Ref Range   WBC 14.3 (*) 4.0 - 10.5 K/uL   RBC 4.07 (*) 4.22 - 5.81 MIL/uL   Hemoglobin 12.4 (*) 13.0 - 17.0 g/dL   HCT 35.6 (*) 39.0 - 52.0 %   MCV 87.5  78.0 - 100.0 fL   MCH 30.5  26.0 - 34.0 pg   MCHC 34.8  30.0 - 36.0 g/dL   RDW 12.6  11.5 - 15.5 %   Platelets 223  150 - 400 K/uL   Dg Femur Left  03/14/2014   CLINICAL DATA:  ORIF left femoral neck fracture.  EXAM: OPERATIVE LEFT FEMUR - 2 VIEW  COMPARISON:  Left hip x-rays yesterday.  FINDINGS: Three spot images from the C-arm fluoroscopic device, AP and lateral views of the left femur were submitted for interpretation postoperatively. ORIF of the intertrochanteric left  femoral neck fracture with placement of image medullary nail and compression screw. Alignment anatomic.  IMPRESSION: Anatomic alignment post ORIF of the left femoral neck fracture.   Electronically Signed   By: Evangeline Dakin M.D.   On: 03/14/2014 13:54   Dg Femur Left Port  03/14/2014   CLINICAL DATA:  Post IM nailing LEFT hip  EXAM: PORTABLE LEFT FEMUR - 2 VIEW  COMPARISON:  Portable exam 1558 hr compared to intraoperative images of 03/14/2014  FINDINGS: Intra medullary nail with proximal compression screw within LEFT femur post ORIF.  LEFT femoral neck fracture seen on earlier study of 03/13/2014 appears reduced.  LEFT hip and knee joints appear normally aligned.  No additional fracture, dislocation, or bone destruction.  IMPRESSION: Post ORIF LEFT femoral neck fracture.  No new abnormalities.   Electronically Signed   By: Lavonia Dana M.D.   On: 03/14/2014 16:21   Dg C-arm 1-60 Min  03/14/2014   CLINICAL DATA:  ORIF left  femoral neck fracture.  EXAM: OPERATIVE LEFT FEMUR - 2 VIEW  COMPARISON:  Left hip x-rays yesterday.  FINDINGS: Three spot images from the C-arm fluoroscopic device, AP and lateral views of the left femur were submitted for interpretation postoperatively. ORIF of the intertrochanteric left femoral neck fracture with placement of image medullary nail and compression screw. Alignment anatomic.  IMPRESSION: Anatomic alignment post ORIF of the left femoral neck fracture.   Electronically Signed   By: Evangeline Dakin M.D.   On: 03/14/2014 13:54    Assessment/Plan: Diagnosis: left intertrochanteric hip fx s/p OIRF 1. Does the need for close, 24 hr/day medical supervision in concert with the patient's rehab needs make it unreasonable for this patient to be served in a less intensive setting? Yes 2. Co-Morbidities requiring supervision/potential complications: htn, abla 3. Due to bladder management, bowel management, safety, skin/wound care, disease management, medication administration, pain management and patient education, does the patient require 24 hr/day rehab nursing? Yes 4. Does the patient require coordinated care of a physician, rehab nurse, PT (1-2 hrs/day, 5 days/week) and OT (1-2 hrs/day, 5 days/week) to address physical and functional deficits in the context of the above medical diagnosis(es)? Yes Addressing deficits in the following areas: balance, endurance, locomotion, strength, transferring, bowel/bladder control, bathing, dressing, feeding, grooming and toileting 5. Can the patient actively participate in an intensive therapy program of at least 3 hrs of therapy per day at least 5 days per week? Yes 6. The potential for patient to make measurable gains while on inpatient rehab is excellent 7. Anticipated functional outcomes upon discharge from inpatient rehab are modified independent  with PT, modified independent with OT, n/a with SLP. 8. Estimated rehab length of stay to reach the above  functional goals is: 7 days 9. Does the patient have adequate social supports to accommodate these discharge functional goals? Yes 10. Anticipated D/C setting: Home 11. Anticipated post D/C treatments: [place] therapy 12. Overall Rehab/Functional Prognosis: excellent  RECOMMENDATIONS: This patient's condition is appropriate for continued rehabilitative care in the following setting: CIR Patient has agreed to participate in recommended program. Yes Note that insurance prior authorization may be required for reimbursement for recommended care.  Comment: Rehab Admissions Coordinator to follow up.  Thanks,  Meredith Staggers, MD, Mellody Drown     03/16/2014

## 2014-03-17 ENCOUNTER — Encounter (HOSPITAL_COMMUNITY): Payer: Self-pay | Admitting: Orthopedic Surgery

## 2014-03-17 DIAGNOSIS — K5909 Other constipation: Secondary | ICD-10-CM | POA: Diagnosis present

## 2014-03-17 DIAGNOSIS — K5900 Constipation, unspecified: Secondary | ICD-10-CM

## 2014-03-17 MED ORDER — MILK AND MOLASSES ENEMA
1.0000 | Freq: Once | RECTAL | Status: AC
  Administered 2014-03-17: 250 mL via RECTAL
  Filled 2014-03-17: qty 250

## 2014-03-17 NOTE — Progress Notes (Signed)
I met with pt at bedside. He just ambulated with nursing to bathroom without difficulty. He is at a level to be able to d/c directly home. He requests practice on stairs prior to d/c which I have alerted P.T. Nursing aware he requests enema prior to d/c home. He will need a RW for d/c also and RN CM is aware. We will sign off. [phone]

## 2014-03-17 NOTE — Discharge Summary (Signed)
Physician Discharge Summary  Patient ID: FOREST REDWINE MRN: [id number] DOB/AGE: 01/21/1931 80 y.o.  Admit date: 03/13/2014 Discharge date: 03/17/2014  Primary Care Physician:  Donnajean Lopes, MD  Discharge Diagnoses:    . Closed left hip fracture . Essential hypertension, benign . Patient is Jehovah's Witness . Other and unspecified hyperlipidemia . Unspecified constipation  Consults:  Orthopedics, Dr. Percell Miller                      CIR   Recommendations for Outpatient Follow-up:  Patient will followup with Dr. Percell Miller in 2 weeks  Allergies:   Allergies  Allergen Reactions  . Tetanus Toxoids   . Other   . Penicillins Hives and Rash     Discharge Medications:   Medication List         aspirin EC 325 MG tablet  Take 1 tablet (325 mg total) by mouth daily.     docusate sodium 100 MG capsule  Commonly known as:  COLACE  Take 1 capsule (100 mg total) by mouth 2 (two) times daily. Continue this while taking narcotics to help with bowel movements     HYDROcodone-acetaminophen 5-325 MG per tablet  Commonly known as:  NORCO  Take 1-2 tablets by mouth every 4 (four) hours as needed for moderate pain.     lovastatin 10 MG tablet  Commonly known as:  MEVACOR  Take 10 mg by mouth at bedtime.     triamterene-hydrochlorothiazide 37.5-25 MG per tablet  Commonly known as:  MAXZIDE-25  Take 1 tablet by mouth daily.         Brief H and P: For complete details please refer to admission H and P, but in brief the patient is 80 year old male who was bringing his wife from the hospital as she was recently hospitalized and tripped. He fell hitting his head on the wall. Patient was unable to bear weight and was found to have left hip fracture.  Hospital Course:   Closed left hip fracture due to mechanical fall: Postop day 3 Patient was admitted and orthopedics was consulted. Patient was seen by Dr. Percell Miller. Patient underwent intramedullary femoral nail surgery on  9/22. Pain is controlled and patient is tolerating diet. PT evaluation was done and recommended CIR. Patient was evaluated by inpatient rehabilitation however he has been high functioning and it was recommended home health PT which was arranged. He will be weightbearing as tolerated and followup with Dr. Percell Miller in 2 weeks.  Essential hypertension, benign - Currently stable   Patient is Jehovah's Witness : Hemoglobin remained stable  Other and unspecified hyperlipidemia : Continue statin  Hyponatremia : Likely due to hypovolemia and HCTZ, sodium has improved to 132 at discharge  Leukocytosis: Likely due to stress emargination, no signs or symptoms of any infection, white count 9.7, improved from 14.3 yesterday.      Day of Discharge BP 116/57  Pulse 76  Temp(Src) 98.3 F (36.8 C) (Oral)  Resp 16  Ht 5\' 10"  (1.778 m)  SpO2 98%  Physical Exam: General: Alert and awake oriented x3 not in any acute distress. CVS: S1-S2 clear no murmur rubs or gallops Chest: clear to auscultation bilaterally, no wheezing rales or rhonchi Abdomen: soft nontender, nondistended, normal bowel sounds Extremities: no cyanosis, clubbing or edema noted bilaterally Neuro: Cranial nerves II-XII intact, no focal neurological deficits   The results of significant diagnostics from this hospitalization (including imaging, microbiology, ancillary and laboratory) are listed below for reference.  LAB RESULTS: Basic Metabolic Panel:  Recent Labs Lab 03/15/14 1128 03/16/14 0814  NA 130* 132*  K 3.7 3.9  CL 93* 95*  CO2 25 28  GLUCOSE 173* 114*  BUN 12 10  CREATININE 0.76 0.70  CALCIUM 9.0 9.1   Liver Function Tests:  Recent Labs Lab 03/14/14 0502  ALBUMIN 3.7   No results found for this basename: LIPASE, AMYLASE,  in the last 168 hours No results found for this basename: AMMONIA,  in the last 168 hours CBC:  Recent Labs Lab 03/13/14 1734  03/15/14 1128 03/16/14 0814  WBC 11.4*  < > 14.3*  9.7  NEUTROABS 9.9*  --   --   --   HGB 12.6*  < > 12.4* 12.3*  HCT 36.7*  < > 35.6* 34.6*  MCV 88.6  < > 87.5 87.6  PLT 221  < > 223 213  < > = values in this interval not displayed. Cardiac Enzymes: No results found for this basename: CKTOTAL, CKMB, CKMBINDEX, TROPONINI,  in the last 168 hours BNP: No components found with this basename: POCBNP,  CBG: No results found for this basename: GLUCAP,  in the last 168 hours  Significant Diagnostic Studies:  Dg Hip Complete Left  03/13/2014   CLINICAL DATA:  Fall, left hip pain  EXAM: LEFT HIP - COMPLETE 2+ VIEW  COMPARISON:  None.  FINDINGS: Nondisplaced transcervical left hip fracture.  Mild degenerative changes of the bilateral hips.  Visualized bony pelvis appears intact.  Degenerative changes of the lower lumbar spine.  IMPRESSION: Nondisplaced transcervical left hip fracture.   Electronically Signed   By: Julian Hy M.D.   On: 03/13/2014 16:44   Dg Femur Left  03/14/2014   CLINICAL DATA:  ORIF left femoral neck fracture.  EXAM: OPERATIVE LEFT FEMUR - 2 VIEW  COMPARISON:  Left hip x-rays yesterday.  FINDINGS: Three spot images from the C-arm fluoroscopic device, AP and lateral views of the left femur were submitted for interpretation postoperatively. ORIF of the intertrochanteric left femoral neck fracture with placement of image medullary nail and compression screw. Alignment anatomic.  IMPRESSION: Anatomic alignment post ORIF of the left femoral neck fracture.   Electronically Signed   By: Evangeline Dakin M.D.   On: 03/14/2014 13:54   Ct Head Wo Contrast  03/13/2014   CLINICAL DATA:  [place] and hit head, nausea, patient on aspirin  EXAM: CT HEAD WITHOUT CONTRAST  TECHNIQUE: Contiguous axial images were obtained from the base of the skull through the vertex without intravenous contrast.  COMPARISON:  None.  FINDINGS: There is moderate diffuse cortical atrophy. There is mild white matter low attenuation bilaterally. There is no evidence of  hemorrhage, extra-axial fluid, or vascular territory infarct. No hydrocephalus or mass. No skull fracture.  IMPRESSION: No acute findings.  Age-related involutional changes noted.   Electronically Signed   By: Skipper Cliche M.D.   On: 03/13/2014 21:47   Dg Chest Portable 1 View  03/13/2014   CLINICAL DATA:  Status post fall, hip pain, cough  EXAM: PORTABLE CHEST - 1 VIEW  COMPARISON:  None.  FINDINGS: The heart size and mediastinal contours are within normal limits. Both lungs are clear. The visualized skeletal structures are unremarkable.  IMPRESSION: No active disease.   Electronically Signed   By: Kathreen Devoid   On: 03/13/2014 17:07   Dg Femur Left Port  03/14/2014   CLINICAL DATA:  Post IM nailing LEFT hip  EXAM: PORTABLE LEFT FEMUR - 2  VIEW  COMPARISON:  Portable exam 1558 hr compared to intraoperative images of 03/14/2014  FINDINGS: Intra medullary nail with proximal compression screw within LEFT femur post ORIF.  LEFT femoral neck fracture seen on earlier study of 03/13/2014 appears reduced.  LEFT hip and knee joints appear normally aligned.  No additional fracture, dislocation, or bone destruction.  IMPRESSION: Post ORIF LEFT femoral neck fracture.  No new abnormalities.   Electronically Signed   By: Lavonia Dana M.D.   On: 03/14/2014 16:21   Dg C-arm 1-60 Min  03/14/2014   CLINICAL DATA:  ORIF left femoral neck fracture.  EXAM: OPERATIVE LEFT FEMUR - 2 VIEW  COMPARISON:  Left hip x-rays yesterday.  FINDINGS: Three spot images from the C-arm fluoroscopic device, AP and lateral views of the left femur were submitted for interpretation postoperatively. ORIF of the intertrochanteric left femoral neck fracture with placement of image medullary nail and compression screw. Alignment anatomic.  IMPRESSION: Anatomic alignment post ORIF of the left femoral neck fracture.   Electronically Signed   By: Evangeline Dakin M.D.   On: 03/14/2014 13:54       Disposition and Follow-up: Discharge Instructions    Increase activity slowly    Complete by:  As directed      Weight bearing as tolerated    Complete by:  As directed             DISPOSITION: Home with home health PT  DIET: Regular diet     DISCHARGE FOLLOW-UP Follow-up Information   Follow up with Edmonia Lynch, D, MD. Schedule an appointment as soon as possible for a visit in 2 weeks. (for hospital follow-up)    Specialty:  Orthopedic Surgery   Contact information:   [address] [place] [address] [phone]       Follow up with Donnajean Lopes, MD. Schedule an appointment as soon as possible for a visit in 2 weeks. (for hospital follow-up)    Specialty:  Internal Medicine   Contact information:   [place] [id number] [phone]       Follow up with [facility]. (Someone from [facility] will contact you concerning start date and time for physical therapy. )    Specialty:  [place] information:   [place] [id number] [phone]       Time spent on Discharge: 40 mins  Signed:   Iasiah Ozment M.D. [facility] 03/17/2014, 11:49 AM Pager: [phone]   **Disclaimer: This note was dictated with voice recognition software. Similar sounding words can inadvertently be transcribed and this note may contain transcription errors which may not have been corrected upon publication of note.**

## 2014-03-17 NOTE — Care Management Note (Signed)
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     Action/Plan:   Case manager spoke with patient concerning home health and DME. Choice offered. Referral called to Rhett Bannister, [facility] Liaison. Patient progressing well, doesnt need inpatient rehab.   Anticipated DC Date:  03/10/2014   Anticipated DC Plan:  [place]  CM consult      PAC Choice  [place]   Choice offered to / List presented to:  C-1 Patient   DME arranged  Vassie Moselle      DME agency  [place] arranged  [place]   Status of service:  Completed, signed off Medicare Important Message given?  YES (If response is "NO", the following Medicare IM given date fields will be blank) Date Medicare IM given:  03/17/2014 Medicare IM given by:  Ricki Miller Date Additional Medicare IM given:   Additional Medicare IM given by:    Discharge Disposition:  [place]  Per UR Regulation:  Reviewed for med. necessity/level of care/duration of stay  If discussed at [place] of Stay Meetings, dates discussed:    Comments:

## 2014-03-17 NOTE — Progress Notes (Signed)
Physical Therapy Treatment Patient Details Name: Clayton Odonnell MRN: [id number] DOB: [birth year] Today's Date: 03/17/2014    History of Present Illness Clayton Odonnell. Clayton Odonnell is an 80 y.o. male s/p IM nail for Lt hip fx on 03/14/14.     PT Comments    Patient progressing well and able to complete stair training this session. Patient is now planning to DC home instead of CIR as he has progressed so well. Patient safe to D/C from a mobility standpoint based on progression towards goals set on PT eval.    Follow Up Recommendations  Home health PT;Supervision for mobility/OOB     Equipment Recommendations  Rolling walker with 5" wheels    Recommendations for Other Services       Precautions / Restrictions Precautions Precautions: Fall Restrictions LLE Weight Bearing: Weight bearing as tolerated    Mobility  Bed Mobility               General bed mobility comments: Patient up in recliner before and after session  Transfers       Sit to Stand: Supervision         General transfer comment: Supervision for safety. Patient with safe technique  Ambulation/Gait Ambulation/Gait assistance: Supervision Ambulation Distance (Feet): 300 Feet Assistive device: Rolling walker (2 wheeled) Gait Pattern/deviations: Step-through pattern;Decreased stride length     General Gait Details: Cues for upright posture otherwise safe technique and use of RW   Stairs Stairs: Yes Stairs assistance: Min guard Stair Management: Step to pattern;Forwards;Two rails Number of Stairs: 5 General stair comments: Patient minguard for safety and cues for technique  Wheelchair Mobility    Modified Rankin (Stroke Patients Only)       Balance                                    Cognition Arousal/Alertness: Awake/alert Behavior During Therapy: WFL for tasks assessed/performed Overall Cognitive Status: Within Functional Limits for tasks assessed                       Exercises      General Comments        Pertinent Vitals/Pain Pain Assessment: No/denies pain    Home Living                      Prior Function            PT Goals (current goals can now be found in the care plan section) Progress towards PT goals: Progressing toward goals    Frequency  Min 3X/week    PT Plan Current plan remains appropriate    Co-evaluation             End of Session Equipment Utilized During Treatment: Gait belt Activity Tolerance: Patient tolerated treatment well Patient left: in chair;with call bell/phone within reach     Time: 1050-1118 PT Time Calculation (min): 28 min  Charges:  $Gait Training: 23-37 mins                    G Codes:      Jacqualyn Posey 03/17/2014, 1:55 PM 03/17/2014 Jacqualyn Posey PTA [phone] pager [phone] office

## 2014-03-17 NOTE — Progress Notes (Signed)
I participated in the care of this patient and agree with the above history, physical and evaluation. I performed a review of the history and a physical exam as detailed   Kirsten Mckone Daniel Dalores Weger MD  

## 2014-03-17 NOTE — Progress Notes (Signed)
Patient ID: Clayton Odonnell, male   DOB: September 17, 1930, 80 y.o.   MRN: [id number]     Subjective:  Patient reports pain as mild.  Patient states that he would like to go home after leaving the hospital in no acute distress and doing well  Objective:   VITALS:   Filed Vitals:   03/16/14 2107 03/17/14 0000 03/17/14 0400 03/17/14 0557  BP: 142/59   116/57  Pulse: 85   76  Temp: 98.3 F (36.8 C)   98.3 F (36.8 C)  TempSrc: Oral   Oral  Resp: 16 16 16 16   Height:      SpO2: 98%   98%    ABD soft Sensation intact distally Dorsiflexion/Plantar flexion intact Incision: dressing C/D/I and no drainage   Lab Results  Component Value Date   WBC 9.7 03/16/2014   HGB 12.3* 03/16/2014   HCT 34.6* 03/16/2014   MCV 87.6 03/16/2014   PLT 213 03/16/2014     Assessment/Plan: 3 Days Post-Op   Principal Problem:   Closed left hip fracture Active Problems:   Essential hypertension, benign   Patient is Jehovah's Witness   Other and unspecified hyperlipidemia   Advance diet Up with therapy WBAT Dry dressing PRN Follow up in 2 weeks with Dr Edmonia Lynch  Continue plan per medicine   Rande Brunt, Erlene Quan 03/17/2014, 7:23 AM   Edmonia Lynch MD [phone]

## 2014-03-20 NOTE — Progress Notes (Signed)
I have reviewed this note and agree with the updated plan of care.  [address] [employer] [place]

## 2014-07-06 ENCOUNTER — Encounter (HOSPITAL_COMMUNITY): Payer: Self-pay | Admitting: Orthopedic Surgery

## 2015-03-30 DIAGNOSIS — Z23 Encounter for immunization: Secondary | ICD-10-CM | POA: Diagnosis not present

## 2015-03-30 DIAGNOSIS — I10 Essential (primary) hypertension: Secondary | ICD-10-CM | POA: Diagnosis not present

## 2015-03-30 DIAGNOSIS — Z125 Encounter for screening for malignant neoplasm of prostate: Secondary | ICD-10-CM | POA: Diagnosis not present

## 2015-03-30 DIAGNOSIS — Z0000 Encounter for general adult medical examination without abnormal findings: Secondary | ICD-10-CM | POA: Diagnosis not present

## 2015-03-30 DIAGNOSIS — E785 Hyperlipidemia, unspecified: Secondary | ICD-10-CM | POA: Diagnosis not present

## 2015-04-05 DIAGNOSIS — Z6823 Body mass index (BMI) 23.0-23.9, adult: Secondary | ICD-10-CM | POA: Diagnosis not present

## 2015-04-05 DIAGNOSIS — I10 Essential (primary) hypertension: Secondary | ICD-10-CM | POA: Diagnosis not present

## 2015-04-05 DIAGNOSIS — Z0000 Encounter for general adult medical examination without abnormal findings: Secondary | ICD-10-CM | POA: Diagnosis not present

## 2015-04-05 DIAGNOSIS — J309 Allergic rhinitis, unspecified: Secondary | ICD-10-CM | POA: Diagnosis not present

## 2015-04-05 DIAGNOSIS — S72009A Fracture of unspecified part of neck of unspecified femur, initial encounter for closed fracture: Secondary | ICD-10-CM | POA: Diagnosis not present

## 2015-04-05 DIAGNOSIS — Z1389 Encounter for screening for other disorder: Secondary | ICD-10-CM | POA: Diagnosis not present

## 2015-04-05 DIAGNOSIS — E785 Hyperlipidemia, unspecified: Secondary | ICD-10-CM | POA: Diagnosis not present

## 2015-06-27 ENCOUNTER — Encounter (HOSPITAL_COMMUNITY): Payer: Self-pay

## 2015-06-27 ENCOUNTER — Emergency Department (HOSPITAL_COMMUNITY): Payer: Medicare HMO

## 2015-06-27 ENCOUNTER — Observation Stay (HOSPITAL_COMMUNITY)
Admission: EM | Admit: 2015-06-27 | Discharge: 2015-06-28 | Disposition: A | Discharge status: Home / Self Care | Payer: Medicare HMO | Attending: Internal Medicine | Admitting: Internal Medicine

## 2015-06-27 ENCOUNTER — Observation Stay (HOSPITAL_BASED_OUTPATIENT_CLINIC_OR_DEPARTMENT_OTHER): Payer: Medicare HMO

## 2015-06-27 ENCOUNTER — Observation Stay (HOSPITAL_COMMUNITY): Payer: Medicare HMO

## 2015-06-27 DIAGNOSIS — Z8701 Personal history of pneumonia (recurrent): Secondary | ICD-10-CM | POA: Insufficient documentation

## 2015-06-27 DIAGNOSIS — Z87891 Personal history of nicotine dependence: Secondary | ICD-10-CM | POA: Diagnosis not present

## 2015-06-27 DIAGNOSIS — R0789 Other chest pain: Secondary | ICD-10-CM | POA: Diagnosis not present

## 2015-06-27 DIAGNOSIS — R079 Chest pain, unspecified: Secondary | ICD-10-CM | POA: Diagnosis not present

## 2015-06-27 DIAGNOSIS — Z789 Other specified health status: Secondary | ICD-10-CM | POA: Diagnosis present

## 2015-06-27 DIAGNOSIS — Z7982 Long term (current) use of aspirin: Secondary | ICD-10-CM | POA: Diagnosis not present

## 2015-06-27 DIAGNOSIS — Z79899 Other long term (current) drug therapy: Secondary | ICD-10-CM | POA: Insufficient documentation

## 2015-06-27 DIAGNOSIS — I10 Essential (primary) hypertension: Secondary | ICD-10-CM | POA: Insufficient documentation

## 2015-06-27 DIAGNOSIS — E785 Hyperlipidemia, unspecified: Secondary | ICD-10-CM | POA: Diagnosis present

## 2015-06-27 DIAGNOSIS — R61 Generalized hyperhidrosis: Secondary | ICD-10-CM | POA: Insufficient documentation

## 2015-06-27 DIAGNOSIS — Z880 Allergy status to penicillin: Secondary | ICD-10-CM | POA: Diagnosis not present

## 2015-06-27 DIAGNOSIS — K5909 Other constipation: Secondary | ICD-10-CM | POA: Diagnosis present

## 2015-06-27 DIAGNOSIS — M5136 Other intervertebral disc degeneration, lumbar region: Secondary | ICD-10-CM | POA: Diagnosis not present

## 2015-06-27 DIAGNOSIS — IMO0001 Reserved for inherently not codable concepts without codable children: Secondary | ICD-10-CM | POA: Diagnosis present

## 2015-06-27 DIAGNOSIS — M546 Pain in thoracic spine: Secondary | ICD-10-CM | POA: Diagnosis not present

## 2015-06-27 DIAGNOSIS — M549 Dorsalgia, unspecified: Secondary | ICD-10-CM | POA: Diagnosis present

## 2015-06-27 DIAGNOSIS — K5900 Constipation, unspecified: Secondary | ICD-10-CM | POA: Diagnosis not present

## 2015-06-27 DIAGNOSIS — F439 Reaction to severe stress, unspecified: Secondary | ICD-10-CM

## 2015-06-27 HISTORY — DX: Procedure and treatment not carried out because of patient's decision for reasons of belief and group pressure: Z53.1

## 2015-06-27 LAB — CBC
HEMATOCRIT: 35.2 % — AB (ref 39.0–52.0)
HEMOGLOBIN: 11.6 g/dL — AB (ref 13.0–17.0)
MCH: 29.6 pg (ref 26.0–34.0)
MCHC: 33 g/dL (ref 30.0–36.0)
MCV: 89.8 fL (ref 78.0–100.0)
Platelets: 231 10*3/uL (ref 150–400)
RBC: 3.92 MIL/uL — AB (ref 4.22–5.81)
RDW: 13.4 % (ref 11.5–15.5)
WBC: 5.7 10*3/uL (ref 4.0–10.5)

## 2015-06-27 LAB — BASIC METABOLIC PANEL
ANION GAP: 9 (ref 5–15)
BUN: 14 mg/dL (ref 6–20)
CALCIUM: 9.3 mg/dL (ref 8.9–10.3)
CO2: 25 mmol/L (ref 22–32)
Chloride: 102 mmol/L (ref 101–111)
Creatinine, Ser: 0.78 mg/dL (ref 0.61–1.24)
GFR calc non Af Amer: >60 mL/min (ref >60)
GLUCOSE: 91 mg/dL (ref 65–99)
POTASSIUM: 3.9 mmol/L (ref 3.5–5.1)
Sodium: 136 mmol/L (ref 135–145)

## 2015-06-27 LAB — URINALYSIS, ROUTINE W REFLEX MICROSCOPIC
BILIRUBIN URINE: NEGATIVE
Glucose, UA: NEGATIVE mg/dL
Hgb urine dipstick: NEGATIVE
KETONES UR: NEGATIVE mg/dL
Leukocytes, UA: NEGATIVE
NITRITE: NEGATIVE
PH: 8 (ref 5.0–8.0)
PROTEIN: NEGATIVE mg/dL
Specific Gravity, Urine: 1.017 (ref 1.005–1.030)

## 2015-06-27 LAB — DIFFERENTIAL
BASOS ABS: 0 10*3/uL (ref 0.0–0.1)
Basophils Relative: 1 %
Eosinophils Absolute: 0.3 10*3/uL (ref 0.0–0.7)
Eosinophils Relative: 5 %
LYMPHS PCT: 19 %
Lymphs Abs: 1.1 10*3/uL (ref 0.7–4.0)
MONO ABS: 0.9 10*3/uL (ref 0.1–1.0)
Monocytes Relative: 15 %
NEUTROS ABS: 3.5 10*3/uL (ref 1.7–7.7)
Neutrophils Relative %: 60 %

## 2015-06-27 LAB — TROPONIN I: Troponin I: <0.03 ng/mL (ref <0.031)

## 2015-06-27 LAB — I-STAT TROPONIN, ED: TROPONIN I, POC: 0 ng/mL (ref 0.00–0.08)

## 2015-06-27 MED ORDER — DICLOFENAC SODIUM 75 MG PO TBEC
75.0000 mg | DELAYED_RELEASE_TABLET | Freq: Two times a day (BID) | ORAL | Status: DC | PRN
  Filled 2015-06-27: qty 1

## 2015-06-27 MED ORDER — ACETAMINOPHEN 325 MG PO TABS
650.0000 mg | ORAL_TABLET | ORAL | Status: DC | PRN
  Administered 2015-06-27: 650 mg via ORAL
  Filled 2015-06-27: qty 2

## 2015-06-27 MED ORDER — ONDANSETRON HCL 4 MG/2ML IJ SOLN: 4.0000 mg | Freq: Four times a day (QID) | INTRAMUSCULAR | Status: DC | PRN

## 2015-06-27 MED ORDER — ONDANSETRON HCL 4 MG/2ML IJ SOLN
4.0000 mg | Freq: Three times a day (TID) | INTRAMUSCULAR | Status: AC | PRN
  Administered 2015-06-27: 4 mg via INTRAVENOUS
  Filled 2015-06-27: qty 2

## 2015-06-27 MED ORDER — ONDANSETRON HCL 4 MG/2ML IJ SOLN
4.0000 mg | Freq: Once | INTRAMUSCULAR | Status: DC
  Filled 2015-06-27: qty 2

## 2015-06-27 MED ORDER — ASPIRIN 81 MG PO CHEW
81.0000 mg | CHEWABLE_TABLET | Freq: Every day | ORAL | Status: DC
  Administered 2015-06-28: 81 mg via ORAL
  Filled 2015-06-27: qty 1

## 2015-06-27 MED ORDER — POLYETHYLENE GLYCOL 3350 17 G PO PACK: 17.0000 g | PACK | Freq: Every day | ORAL | Status: DC | PRN

## 2015-06-27 MED ORDER — MORPHINE SULFATE (PF) 2 MG/ML IV SOLN
2.0000 mg | INTRAVENOUS | Status: DC | PRN
  Administered 2015-06-27: 2 mg via INTRAVENOUS
  Filled 2015-06-27: qty 1

## 2015-06-27 MED ORDER — TRIAMTERENE-HCTZ 37.5-25 MG PO TABS
1.0000 | ORAL_TABLET | Freq: Every day | ORAL | Status: DC
  Administered 2015-06-27 – 2015-06-28 (×2): 1 via ORAL
  Filled 2015-06-27 (×2): qty 1

## 2015-06-27 MED ORDER — MORPHINE SULFATE (PF) 4 MG/ML IV SOLN
4.0000 mg | Freq: Once | INTRAVENOUS | Status: DC
  Filled 2015-06-27: qty 1

## 2015-06-27 MED ORDER — PRAVASTATIN SODIUM 20 MG PO TABS
10.0000 mg | ORAL_TABLET | Freq: Every day | ORAL | Status: DC
  Administered 2015-06-27: 10 mg via ORAL
  Filled 2015-06-27: qty 1

## 2015-06-27 MED ORDER — ASPIRIN 81 MG PO CHEW: 324.0000 mg | CHEWABLE_TABLET | Freq: Once | ORAL | Status: DC

## 2015-06-27 MED ORDER — ENOXAPARIN SODIUM 40 MG/0.4ML ~~LOC~~ SOLN
40.0000 mg | SUBCUTANEOUS | Status: DC
  Filled 2015-06-27: qty 0.4

## 2015-06-27 MED ORDER — DOCUSATE SODIUM 100 MG PO CAPS
100.0000 mg | ORAL_CAPSULE | Freq: Two times a day (BID) | ORAL | Status: DC
  Administered 2015-06-27 – 2015-06-28 (×3): 100 mg via ORAL
  Filled 2015-06-27 (×3): qty 1

## 2015-06-27 MED ORDER — HYDRALAZINE HCL 20 MG/ML IJ SOLN: 5.0000 mg | INTRAMUSCULAR | Status: DC | PRN

## 2015-06-27 NOTE — ED Notes (Signed)
Pt being transported upstairs by Amy, NT

## 2015-06-27 NOTE — ED Notes (Signed)
Hospitalist at the bedside 

## 2015-06-27 NOTE — ED Notes (Signed)
Per EMS< Pt was sitting at home this morning after getting up when he had a new onset of chest pressure 8/10 and pain in his left lateral chest. Pt reports some diaphoresis, but denies any SOB, Dizziness, or nausea. Pt was instructed to take 324 mg of Aspirin per dispatched. When EMS arrived, pt was chest pain free. Pt reports back with hx of the same due to injury three weeks ago while moving wife. Vitals per EMS: 68 HR, 99% on RA, 150/80, 16 RR, EKG showed 1st Degree Block with no Hx noted.

## 2015-06-27 NOTE — H&P (Signed)
[facility] History and Physical  Clayton Odonnell [id number] DOB: [birth year] DOA: 06/27/2015  Referring physician: Emergency Department PCP: Donnajean Lopes, MD   CHIEF COMPLAINT:   Chest pain       HPI: Clayton Odonnell is a 82 y.o. male , relatively healthy, brought in by EMS today with chest pain.. Pain predominantly left axilla radiating upward to left chest and into left shoulder. Pain occurred at rest, lasted about 5-10 minutes. He had associated diaphoresis. No SOB or nausea. Currently pain free. He received NTG and ASA by EMS.   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 He took her out of hospital several weeks ago. Patient has to lift patient to get her to the bathroom or into chair. He has recently developed significant right upper back pain. Pain is affecting his sleep. Patient has tried to sleep on the floor. He's taken Tylenol and tried Lidoderm patches without improvement.         ED COURSE:           Labs:   Electrolytes normal, renal function normal Normal white count, hemoglobin 11.6 Trop 0.0  Urinalysis:     hazy, negative nitrites, negative leukocytes           CXR:     Normal         EKG:     Sinus rhythm Prolonged PR interval Incomplete right bundle branch block Borderline low voltage, extremity leads          QT 464          Medications  triamterene-hydrochlorothiazide (MAXZIDE-25) 37.5-25 MG per tablet 1 tablet (not administered)  docusate sodium (COLACE) capsule 100 mg (not administered)  morphine 4 MG/ML injection 4 mg (not administered)  ondansetron (ZOFRAN) injection 4 mg (not administered)    Review of Systems  Constitutional: Negative.   Eyes: Negative.   Respiratory: Negative.   Cardiovascular: Positive for chest pain.  Gastrointestinal: Positive for constipation.  Genitourinary: Negative.   Musculoskeletal: Positive for back pain and neck pain.  Skin: Negative.   Neurological: Negative.     Endo/Heme/Allergies: Negative.     Past Medical History  Diagnosis Date  . Hypertension   . Hyperlipemia   . Pneumonia    Past Surgical History  Procedure Laterality Date  . Tonsillectomy    . Femur im nail Left 03/14/2014    Procedure: INTRAMEDULLARY (IM) NAIL FEMORAL;  Surgeon: Renette Butters, MD;  Location: [facility];  Service: Orthopedics;  Laterality: Left;    SOCIAL HISTORY:  reports that he quit smoking about 62 years ago. His smoking use included Cigarettes. He has never used smokeless tobacco. He reports that he drinks about 8.4 oz of alcohol per week. He reports that he does not use illicit drugs. Lives: at home with wife   Assistive devices:   None needed for ambulation.   Allergies  Allergen Reactions  . Tetanus Toxoids   . Other   . Penicillins Hives and Rash    Family History  Problem Relation Age of Onset  . CAD Brother   . Osteoporosis Daughter     Prior to Admission medications   Medication Sig Start Date End Date Taking? Authorizing Provider  acetaminophen (TYLENOL) 500 MG tablet Take 1,000 mg by mouth every 8 (eight) hours as needed (pain).   Yes Historical Provider, MD  aspirin 81 MG chewable tablet Chew 324 mg by mouth once.   Yes Historical Provider, MD  lovastatin (MEVACOR) 10 MG  tablet Take 10 mg by mouth at bedtime.   Yes Historical Provider, MD  meloxicam (MOBIC) 15 MG tablet Take 15 mg by mouth daily as needed for pain.   Yes Historical Provider, MD  triamterene-hydrochlorothiazide (MAXZIDE-25) 37.5-25 MG per tablet Take 1 tablet by mouth daily.   Yes Historical Provider, MD   PHYSICAL EXAM: Filed Vitals:   06/27/15 0845 06/27/15 0900 06/27/15 0915 06/27/15 0945  BP: 191/73 168/87 167/90 173/80  Pulse: 64 53 51 58  Temp:      TempSrc:      Resp: 20 19 12 19   Height:      Weight:      SpO2: 98% 99% 100% 100%    Wt Readings from Last 3 Encounters:  06/27/15 72.576 kg (160 lb)    General:  Pleasant thin, white male. Appears calm and  comfortable Eyes: PER, normal lids, irises & conjunctiva ENT: grossly normal hearing, lips & tongue Neck: no LAD, no masses Cardiovascular: RRR, no murmurs. No LE edema.  Respiratory: Respirations even and unlabored. Normal respiratory effort. Lungs CTA bilaterally, no wheezes / rales .   Abdomen: soft, non-distended, non-tender, active bowel sounds. No obvious masses.  Skin: no rash seen on limited exam Musculoskeletal: grossly normal tone BUE/BLE Psychiatric: grossly normal mood and affect, speech fluent and appropriate Neurologic: grossly non-focal.         LABS ON ADMISSION:    Basic Metabolic Panel:  Recent Labs Lab 06/27/15 0827  NA 136  K 3.9  CL 102  CO2 25  GLUCOSE 91  BUN 14  CREATININE 0.78  CALCIUM 9.3   CBC:  Recent Labs Lab 06/27/15 0827  WBC 5.7  NEUTROABS 3.5  HGB 11.6*  HCT 35.2*  MCV 89.8  PLT 231    CREATININE: 0.78 (06/27/15 0827) Estimated creatinine clearance - 71.8 mL/min  Radiological Exams on Admission: Dg Chest 2 View  06/27/2015  CLINICAL DATA:  Left-sided chest pain. EXAM: CHEST  2 VIEW COMPARISON:  05/13/2014 FINDINGS: The heart size and mediastinal contours are within normal limits. Both lungs are clear. The visualized skeletal structures are unremarkable. IMPRESSION: Normal chest. Electronically Signed   By: Lorriane Shire M.D.   On: 06/27/2015 08:49    ASSESSMENT / PLAN   Chest pain. Chest pain score 5. Initial troponin normal. No acute EKG changes. Chest x-ray unremarkable. Patient does have risk factors for coronary artery disease.  -Admit for observation- telemetry bed -Obtain echocardogram -Cycle troponins -A.m. EKG  Right upper back pain. Suspect muscular in origin. 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 He lifts wife throughout the day.   -Need to exclude fracture. X-ray pending -Lidoderm patches at home apparently didn't help.  Essential hypertension, benign -Continue home triamterene  Patient is Jehovah's Witness. No blood  products    Chronic constipation. Uses MiraLAX as needed -Colace daily -When necessary MiraLAX    Hyperlipidemia. Patient forgets to take Mevacor -Resume Mevacor  Stress at home. 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 Patient has to lift wife throughout the day. Having back /shoulder pain from strain. Wife was in Hospice until a few weeks ago.  -will ask Education officer, museum to evaluate. Could patient get some assistance at home with wife?   CONSULTANTS:  None   Code Status: DO NOT RESUSCITATE DVT Prophylaxis: Lovenox Family Communication:  Patient alert, oriented and understands plan of care.  Disposition Plan: Discharge to home in 24-48 hours   Time spent: 60 minutes Tye Savoy  NP [facility] Pager [phone]

## 2015-06-27 NOTE — Progress Notes (Signed)
  Echocardiogram 2D Echocardiogram has been performed.  Clayton Odonnell 06/27/2015, 3:30 PM

## 2015-06-27 NOTE — ED Notes (Signed)
Attempted Report x1.   

## 2015-06-27 NOTE — ED Notes (Signed)
Attempted Reported x2

## 2015-06-27 NOTE — ED Notes (Signed)
PA at the bedside.

## 2015-06-27 NOTE — ED Notes (Signed)
Pt came back from Xray. Pt waiting to be transferred upstairs

## 2015-06-27 NOTE — ED Notes (Signed)
Pt returned from X-ray.  

## 2015-06-27 NOTE — ED Provider Notes (Signed)
Medical screening examination/treatment/procedure(s) were conducted as a shared visit with non-physician practitioner(s) and myself.  I personally evaluated the patient during the encounter.   EKG Interpretation   Date/Time:  Wednesday June 27 2015 07:57:30 EST Ventricular Rate:  53 PR Interval:  233 QRS Duration: 116 QT Interval:  464 QTC Calculation: 436 R Axis:   31 Text Interpretation:  Sinus rhythm Prolonged PR interval Incomplete right  bundle branch block Borderline low voltage, extremity leads No significant  change since last tracing Confirmed by Ikeisha Blumberg MD, Rula Keniston [id number]) on  06/27/2015 9:20:32 AM     82 y.o. male presents with high risk chest pain with typical symptoms, heart score of 5. No acute distress on exam. Negative trop, admit for CP r/o.  See related encounter note   Leo Grosser, MD 06/27/15 1031

## 2015-06-27 NOTE — ED Provider Notes (Signed)
CSN: [id number]     Arrival date & time 06/27/15  0757 History   First MD Initiated Contact with Patient 06/27/15 0813     Chief Complaint  Patient presents with  . Chest Pain     (Consider location/radiation/quality/duration/timing/severity/associated sxs/prior Treatment) HPI   Clayton Odonnell is a 82 y.o. male, with a history of hypertension and hyperlipidemia, presenting to the ED with left sided chest pain that came on suddenly while at rest at around 7 am this morning. Pt states he has never felt a pain like this before. Patient was resting on the sofa reading a book when the pain occurred. Pt took 324 mg ASA PTA. Pt is currently without any chest pain. Pt rates his chest pain at 8/10 when it was occurring, lasted for about 5-10 min, describes it as a pressure, that radiated down his left arm, was accompanied by a cold sweat, and was relieved by aspirin and 1 nitroglycerin. Pt states he has had a stress test before due to some chest and left shoulder pain that turned out to be a rotator cuff issue. Pt also complains of mid back pain that began 3-4 weeks ago that started when he was lifting his wife out of bed and the weight was too much. Rates his back pain at 5/10, worse with movement or leaning over, describes as a tightness, non-radiating. Pt denies shortness of breath, N/V, abdominal pain, cough, recent illness, falls or trauma, or any other complaints. Pt states he hasn't taken his BP medication today or yesterday, which he states is because he has been busy taking care of his bedbound wife at home.     Past Medical History  Diagnosis Date  . Hypertension   . Hyperlipemia   . Pneumonia    Past Surgical History  Procedure Laterality Date  . Tonsillectomy    . Femur im nail Left 03/14/2014    Procedure: INTRAMEDULLARY (IM) NAIL FEMORAL;  Surgeon: Renette Butters, MD;  Location: [place];  Service: Orthopedics;  Laterality: Left;   Family History  Problem Relation Age of Onset   . CAD Brother   . Osteoporosis Daughter    Social History  Substance Use Topics  . Smoking status: Former Smoker    Types: Cigarettes    Quit date: 03/13/1953  . Smokeless tobacco: Never Used  . Alcohol Use: 8.4 oz/week    14 Cans of beer per week     Comment: 1 beers a day    Review of Systems  Constitutional: Positive for diaphoresis. Negative for fever and chills.  Respiratory: Negative for cough and shortness of breath.   Cardiovascular: Positive for chest pain.  Gastrointestinal: Negative for nausea, vomiting and abdominal pain.  Genitourinary: Negative for dysuria and hematuria.  Neurological: Negative for dizziness and light-headedness.  All other systems reviewed and are negative.     Allergies  Tetanus toxoids; Other; and Penicillins  Home Medications   Prior to Admission medications   Medication Sig Start Date End Date Taking? Authorizing Provider  acetaminophen (TYLENOL) 500 MG tablet Take 1,000 mg by mouth every 8 (eight) hours as needed (pain).   Yes Historical Provider, MD  aspirin 81 MG chewable tablet Chew 324 mg by mouth once.   Yes Historical Provider, MD  lovastatin (MEVACOR) 10 MG tablet Take 10 mg by mouth at bedtime.   Yes Historical Provider, MD  meloxicam (MOBIC) 15 MG tablet Take 15 mg by mouth daily as needed for pain.   Yes Historical  Provider, MD  triamterene-hydrochlorothiazide (MAXZIDE-25) 37.5-25 MG per tablet Take 1 tablet by mouth daily.   Yes Historical Provider, MD   BP 170/80 mmHg  Pulse 50  Temp(Src) 97.7 F (36.5 C) (Oral)  Resp 13  Ht 5\' 10"  (1.778 m)  Wt 72.576 kg  BMI 22.96 kg/m2  SpO2 99% Physical Exam  Constitutional: He is oriented to person, place, and time. He appears well-developed and well-nourished. No distress.  HENT:  Head: Normocephalic and atraumatic.  Eyes: Conjunctivae are normal. Pupils are equal, round, and reactive to light.  Neck: Neck supple.  Cardiovascular: Normal rate, regular rhythm, normal heart  sounds and intact distal pulses.   Pulmonary/Chest: Effort normal and breath sounds normal. No respiratory distress.  Abdominal: Soft. Bowel sounds are normal. There is no tenderness.  Musculoskeletal: He exhibits no edema or tenderness.  Full ROM in all extremities and spine. No paraspinal tenderness. Tenderness to musculature of right thoracic spine. No deformities or crepitus noted.   Lymphadenopathy:    He has no cervical adenopathy.  Neurological: He is alert and oriented to person, place, and time. He has normal reflexes.  No sensory deficits. Strength 5/5 in all extremities. No gait disturbance. Coordination intact. Cranial nerves III-XII grossly intact. No facial droop.   Skin: Skin is warm and dry. He is not diaphoretic.  Nursing note and vitals reviewed.   ED Course  Procedures (including critical care time) Labs Review Labs Reviewed  CBC - Abnormal; Notable for the following:    RBC 3.92 (*)    Hemoglobin 11.6 (*)    HCT 35.2 (*)    All other components within normal limits  URINALYSIS, ROUTINE W REFLEX MICROSCOPIC (NOT AT [facility]) - Abnormal; Notable for the following:    APPearance HAZY (*)    All other components within normal limits  BASIC METABOLIC PANEL  DIFFERENTIAL  Randolm Idol, ED    Imaging Review Dg Chest 2 View  06/27/2015  CLINICAL DATA:  Left-sided chest pain. EXAM: CHEST  2 VIEW COMPARISON:  05/13/2014 FINDINGS: The heart size and mediastinal contours are within normal limits. Both lungs are clear. The visualized skeletal structures are unremarkable. IMPRESSION: Normal chest. Electronically Signed   By: Lorriane Shire M.D.   On: 06/27/2015 08:49   Dg Thoracic Spine 2 View  06/27/2015  CLINICAL DATA:  Thoracic spine pain. EXAM: THORACIC SPINE 2 VIEWS COMPARISON:  Chest x-ray dated 03/13/2014 FINDINGS: Is what appears to be an old compression fracture of T9 with slight anterior wedge deformities of T7 and T8 and the lesser degree of T6. There is diffuse  osteopenia. No bone destruction. No widening of the paraspinal line. IMPRESSION: Multiple old slight compression deformities in the mid thoracic spine. I do not think any these abnormalities are acute. Electronically Signed   By: Lorriane Shire M.D.   On: 06/27/2015 10:23   Dg Lumbar Spine Complete  06/27/2015  CLINICAL DATA:  Thoracic back pain EXAM: LUMBAR SPINE - COMPLETE 4+ VIEW COMPARISON:  None. FINDINGS: Osseous demineralization diffusely. Five non-rib-bearing lumbar vertebra. Minimal scattered disc space narrowing and endplate spur formation greatest at L4-L5. Disc calcification at T12-L1. Vertebral body heights maintained without fracture or subluxation. No bone destruction or spondylolysis. SI joints symmetric. IMPRESSION: Osseous demineralization with degenerative disc disease changes. No acute abnormalities. Electronically Signed   By: Lavonia Dana M.D.   On: 06/27/2015 10:21   I have personally reviewed and evaluated these images and lab results as part of my medical decision-making.  EKG Interpretation   Date/Time:  Wednesday June 27 2015 07:57:30 EST Ventricular Rate:  53 PR Interval:  233 QRS Duration: 116 QT Interval:  464 QTC Calculation: 436 R Axis:   31 Text Interpretation:  Sinus rhythm Prolonged PR interval Incomplete right  bundle branch block Borderline low voltage, extremity leads No significant  change since last tracing Confirmed by KNOTT MD, DANIEL [id number]) on  06/27/2015 9:20:32 AM      MDM   Final diagnoses:  Chest pain, unspecified chest pain type    Clayton Odonnell presents with an episode of chest pain that occurred this morning.  Findings and plan of care discussed with Clayton Grosser, MD.  8:25 AM Patient was in xray upon attempt at initial contact. This patient has highly suspicious chest pain with heart score of 5, indicating moderate risk. Patient needs to be admitted for chest pain observation. This information was communicated with the  patient, who agreed to the admission. Patient's daughter at the bedside states that she would take care of the patient's wife at home so the patient need not worry. Spinal xrays ordered on patient request due to his ongoing back pain. Patient's home blood pressure medications were also ordered. 9:37 AM spoke with Nevin Bloodgood from [facility], who agreed to admit the patient for observation to a telemetry bed. Attending is Dr. Marily Memos. No further instructions.  Filed Vitals:   06/27/15 0945 06/27/15 1012 06/27/15 1013 06/27/15 1015  BP: 173/80 161/73  170/80  Pulse: 58 54 53 50  Temp:      TempSrc:      Resp: 19  14 13   Height:      Weight:      SpO2: 100% 99% 100% 99%     Lorayne Bender, PA-C 06/27/15 1032  Clayton Grosser, MD 06/28/15 [phone]

## 2015-06-27 NOTE — ED Notes (Signed)
Phlebotomy at the bedside  

## 2015-06-27 NOTE — ED Notes (Signed)
Pt given urinal for urine specimen. 

## 2015-06-27 NOTE — ED Notes (Signed)
MD Merrell at the bedside  

## 2015-06-28 ENCOUNTER — Encounter (HOSPITAL_COMMUNITY): Payer: Self-pay | Admitting: Cardiology

## 2015-06-28 DIAGNOSIS — I10 Essential (primary) hypertension: Secondary | ICD-10-CM | POA: Diagnosis not present

## 2015-06-28 DIAGNOSIS — E785 Hyperlipidemia, unspecified: Secondary | ICD-10-CM | POA: Diagnosis not present

## 2015-06-28 DIAGNOSIS — M546 Pain in thoracic spine: Secondary | ICD-10-CM | POA: Diagnosis not present

## 2015-06-28 DIAGNOSIS — R079 Chest pain, unspecified: Secondary | ICD-10-CM | POA: Diagnosis not present

## 2015-06-28 DIAGNOSIS — R61 Generalized hyperhidrosis: Secondary | ICD-10-CM | POA: Diagnosis not present

## 2015-06-28 MED ORDER — ALUM & MAG HYDROXIDE-SIMETH 200-200-20 MG/5ML PO SUSP: 15.0000 mL | Freq: Four times a day (QID) | ORAL | Status: DC | PRN

## 2015-06-28 NOTE — Progress Notes (Signed)
PT Cancellation Note  Patient Details Name: Clayton Odonnell MRN: [id number] DOB: 22-Dec-1930   Cancelled Treatment:    Reason Eval/Treat Not Completed: Other (comment) (Pt had already d/c when PT attempted to see).  Joslyn Hy PT, DPT [phone] Pager: [phone] 06/28/2015, 1:41 PM

## 2015-06-28 NOTE — Care Management Obs Status (Signed)
[facility] NOTIFICATION   Patient Details  Name: ORAZIO FRASCA MRN: [id number] Date of Birth: [birth year]   Medicare Observation Status Notification Given:  Yes    Bethena Roys, RN 06/28/2015, 11:32 AM

## 2015-06-28 NOTE — Discharge Summary (Signed)
JAMIESON CONCHA, is a 82 y.o. male  DOB 1930/07/18  MRN [id number].  Admission date:  06/27/2015  Admitting Physician  Waldemar Dickens, MD  Discharge Date:  06/28/2015   Primary MD  Donnajean Lopes, MD  Recommendations for primary care physician for things to follow:  - Check basic labs including CBC, BMP during next visit.    Admission Diagnosis  Chest pain, unspecified chest pain type [R07.9]   Discharge Diagnosis  Chest pain, unspecified chest pain type [R07.9]   Active Problems:   Essential hypertension, benign   Patient is Jehovah's Witness   Chronic constipation   Chest pain   Hyperlipidemia   Upper back pain on right side   Stress at home   Pain in the chest      Past Medical History  Diagnosis Date  . Hypertension   . Hyperlipemia   . Pneumonia   . Transfusion of blood product refused for religious reason     Past Surgical History  Procedure Laterality Date  . Tonsillectomy    . Femur im nail Left 03/14/2014    Procedure: INTRAMEDULLARY (IM) NAIL FEMORAL;  Surgeon: Renette Butters, MD;  Location: [facility];  Service: Orthopedics;  Laterality: Left;       History of present illness and  Hospital Course:     Kindly see H&P for history of present illness and admission details, please review complete Labs, Consult reports and Test reports for all details in brief  HPI  from the history and physical done on the day of admission 06/27/2015 : CALDWELL TRIANO is a 82 y.o. male , relatively healthy, brought in by EMS today with chest pain.. Pain predominantly left axilla radiating upward to left chest and into left shoulder. Pain occurred at rest, lasted about 5-10 minutes. He had associated diaphoresis. No SOB or nausea. Currently pain free. He received NTG and ASA by EMS.   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 He took her out of hospital several weeks ago. Patient  has to lift patient to get her to the bathroom or into chair. He has recently developed significant right upper back pain. Pain is affecting his sleep. Patient has tried to sleep on the floor. He's taken Tylenol and tried Lidoderm patches without improvement.   Hospital Course   Chest pain/Atypical. - Patient was seen by cardiology, chest pain was felt to be non-typical, with no evidence of ischemia, negative troponins 3, EKG nonacute, 2-D echo with EF 55%, no regional wall motion abnormalities, no further workup is recommended at this pointby cardiology  Right upper back pain.  -  muscular in origin. 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 He lifts wife throughout the day.  - No evidence of fracture on thoracic/lumbar x-ray.   Essential hypertension, - Initially uncontrolled, but controlled at time of discharge so no change on home medication, Continue home triamterene .  Patient is Jehovah's Witness. - No blood products   Hyperlipidemia.  - Statin resumed on discharge  Discharge Condition: Stable   Follow UP  Follow-up Information    Follow up with Donnajean Lopes, MD. Schedule an appointment as soon as possible for a visit in 1 week.   Specialty:  Internal Medicine   Why:  Posthospitalization follow-up   Contact information:   [address] [place] [address] [phone]         Discharge Instructions  and  Discharge Medications    Discharge Instructions    Diet - low sodium heart healthy    Complete by:  As directed      Discharge instructions    Complete by:  As directed   Follow with Primary MD Donnajean Lopes, MD in 7 days   Get CBC, CMP, checked  by Primary MD next visit.    Activity: As tolerated with Full fall precautions use walker/cane & assistance as needed   Disposition Home    Diet: Heart Healthy ** , with feeding assistance and aspiration precautions.  For Heart failure patients - Check your Weight same time everyday, if  you gain over 2 pounds, or you develop in leg swelling, experience more shortness of breath or chest pain, call your Primary MD immediately. Follow Cardiac Low Salt Diet and 1.5 lit/day fluid restriction.   On your next visit with your primary care physician please Get Medicines reviewed and adjusted.   Please request your Prim.MD to go over all Hospital Tests and Procedure/Radiological results at the follow up, please get all Hospital records sent to your Prim MD by signing hospital release before you go home.   If you experience worsening of your admission symptoms, develop shortness of breath, life threatening emergency, suicidal or homicidal thoughts you must seek medical attention immediately by calling 911 or calling your MD immediately  if symptoms less severe.  You Must read complete instructions/literature along with all the possible adverse reactions/side effects for all the Medicines you take and that have been prescribed to you. Take any new Medicines after you have completely understood and accpet all the possible adverse reactions/side effects.   Do not drive, operating heavy machinery, perform activities at heights, swimming or participation in water activities or provide baby sitting services if your were admitted for syncope or siezures until you have seen by Primary MD or a Neurologist and advised to do so again.  Do not drive when taking Pain medications.    Do not take more than prescribed Pain, Sleep and Anxiety Medications  Special Instructions: If you have smoked or chewed Tobacco  in the last 2 yrs please stop smoking, stop any regular Alcohol  and or any Recreational drug use.  Wear Seat belts while driving.   Please note  You were cared for by a hospitalist during your hospital stay. If you have any questions about your discharge medications or the care you received while you were in the hospital after you are discharged, you can call the unit and asked to speak  with the hospitalist on call if the hospitalist that took care of you is not available. Once you are discharged, your primary care physician will handle any further medical issues. Please note that NO REFILLS for any discharge medications will be authorized once you are discharged, as it is imperative that you return to your primary care physician (or establish a relationship with a primary care physician if you do not have one) for your aftercare needs so that they can reassess your need for medications and monitor your lab values.  Increase activity slowly    Complete by:  As directed             Medication List    TAKE these medications        acetaminophen 500 MG tablet  Commonly known as:  TYLENOL  Take 1,000 mg by mouth every 8 (eight) hours as needed (pain).     aspirin 81 MG chewable tablet  Chew 324 mg by mouth once.     lovastatin 10 MG tablet  Commonly known as:  MEVACOR  Take 10 mg by mouth at bedtime.     meloxicam 15 MG tablet  Commonly known as:  MOBIC  Take 15 mg by mouth daily as needed for pain.     triamterene-hydrochlorothiazide 37.5-25 MG tablet  Commonly known as:  MAXZIDE-25  Take 1 tablet by mouth daily.          Diet and Activity recommendation: See Discharge Instructions above   Consults obtained -  Cardiology   Major procedures and Radiology Reports - PLEASE review detailed and final reports for all details, in brief -    Dg Chest 2 View  06/27/2015  CLINICAL DATA:  Left-sided chest pain. EXAM: CHEST  2 VIEW COMPARISON:  05/13/2014 FINDINGS: The heart size and mediastinal contours are within normal limits. Both lungs are clear. The visualized skeletal structures are unremarkable. IMPRESSION: Normal chest. Electronically Signed   By: Lorriane Shire M.D.   On: 06/27/2015 08:49   Dg Thoracic Spine 2 View  06/27/2015  CLINICAL DATA:  Thoracic spine pain. EXAM: THORACIC SPINE 2 VIEWS COMPARISON:  Chest x-ray dated 03/13/2014 FINDINGS: Is what  appears to be an old compression fracture of T9 with slight anterior wedge deformities of T7 and T8 and the lesser degree of T6. There is diffuse osteopenia. No bone destruction. No widening of the paraspinal line. IMPRESSION: Multiple old slight compression deformities in the mid thoracic spine. I do not think any these abnormalities are acute. Electronically Signed   By: Lorriane Shire M.D.   On: 06/27/2015 10:23   Dg Lumbar Spine Complete  06/27/2015  CLINICAL DATA:  Thoracic back pain EXAM: LUMBAR SPINE - COMPLETE 4+ VIEW COMPARISON:  None. FINDINGS: Osseous demineralization diffusely. Five non-rib-bearing lumbar vertebra. Minimal scattered disc space narrowing and endplate spur formation greatest at L4-L5. Disc calcification at T12-L1. Vertebral body heights maintained without fracture or subluxation. No bone destruction or spondylolysis. SI joints symmetric. IMPRESSION: Osseous demineralization with degenerative disc disease changes. No acute abnormalities. Electronically Signed   By: Lavonia Dana M.D.   On: 06/27/2015 10:21    Micro Results     No results found for this or any previous visit (from the past 240 hour(s)).     Today   Subjective:   Carlo Hopkins today has no headache,no chest abdominal pain,no new weakness tingling or numbness, feels much better wants to go home today.   Objective:   Blood pressure 145/54, pulse 61, temperature 97.7 F (36.5 C), temperature source Oral, resp. rate 18, height 5\' 10"  (1.778 m), weight 72.712 kg (160 lb 4.8 oz), SpO2 98 %.   Intake/Output Summary (Last 24 hours) at 06/28/15 1010 Last data filed at 06/28/15 0645  Gross per 24 hour  Intake    600 ml  Output    650 ml  Net    -50 ml    Exam Awake Alert, Oriented x 3, No new F.N deficits, Normal affect [place].AT,PERRAL Supple Neck,No JVD, No cervical lymphadenopathy appriciated.  Symmetrical Chest wall movement, Good air movement bilaterally, CTAB RRR,No Gallops,Rubs or new  Murmurs, No Parasternal Heave +ve B.Sounds, Abd Soft, Non tender, No organomegaly appriciated, No rebound -guarding or rigidity. No Cyanosis, Clubbing or edema, No new Rash or bruise  Data Review   CBC w Diff: Lab Results  Component Value Date   WBC 5.7 06/27/2015   HGB 11.6* 06/27/2015   HCT 35.2* 06/27/2015   PLT 231 06/27/2015   LYMPHOPCT 19 06/27/2015   MONOPCT 15 06/27/2015   EOSPCT 5 06/27/2015   BASOPCT 1 06/27/2015    CMP: Lab Results  Component Value Date   NA 136 06/27/2015   K 3.9 06/27/2015   CL 102 06/27/2015   CO2 25 06/27/2015   BUN 14 06/27/2015   CREATININE 0.78 06/27/2015   ALBUMIN 3.7 03/14/2014  .   Total Time in preparing paper work, data evaluation and todays exam - 35 minutes  Katie Faraone M.D on 06/28/2015 at 10:10 AM  [facility]   Office  [phone]

## 2015-06-28 NOTE — Consult Note (Signed)
CARDIOLOGY CONSULT NOTE   Patient ID: ALEXES LANNIN MRN: [id number] DOB/AGE: [birth year] 82 y.o.  Admit date: 06/27/2015  Primary Physician   Donnajean Lopes, MD Primary Cardiologist  New Reason for Consultation  Chest pain Requesting Physician Dr. Waldron Labs  HPI: DAMEER LAMON is a 82 y.o. male with a history of hypertension and hyperlipidemia who is a [place] witness admitted yesterday for evaluation of chest pain and right upper back pain.  No significant prior cardiac history. He had a normal stress test about 15-20 years ago by Dr. Rex Kras on evaluation of left upper arm pain. He is under a lot of stress recently. He takes care of his bed bound wife due to stroke. For the last one month he has being having right upper back pain due to a lot of lifting. Yesterday morning he developed left axillary/lateral pressure at rest which lasted for 3-5 minutes. Upon EMS arrival he was chest pain-free. He was brought to ED for further evaluation.The patient denies nausea, vomiting, fever, palpitations, shortness of breath, orthopnea, PND, dizziness, syncope, cough, congestion, abdominal pain, hematochezia, melena, lower extremity edema.  EKG shows normal sinus rhythm at the rate of 53 bpm and prolonged PR interval which appears chronic. Troponin x 3 negative. Echo showed LV function of 60-65%, no wm abnormality, mild MR. CXR is clear. Thoracic spine xray showed multiple old slight compression deformities in the mid thoracic spine. No acute findings. Lumbar spine xray with DDD.   Brother has hx of CAD. Unable to provide further information. Currently chest pain free, eating and wanted to go home.   Past Medical History  Diagnosis Date  . Hypertension   . Hyperlipemia   . Pneumonia   . Transfusion of blood product refused for religious reason      Past Surgical History  Procedure Laterality Date  . Tonsillectomy    . Femur im nail Left 03/14/2014    Procedure: INTRAMEDULLARY  (IM) NAIL FEMORAL;  Surgeon: Renette Butters, MD;  Location: [facility];  Service: Orthopedics;  Laterality: Left;    Allergies  Allergen Reactions  . Tetanus Toxoids   . Other   . Penicillins Hives and Rash    I have reviewed the patient's current medications . aspirin  81 mg Oral Daily  . docusate sodium  100 mg Oral BID  . enoxaparin (LOVENOX) injection  40 mg Subcutaneous Q24H  .  morphine injection  4 mg Intravenous Once  . ondansetron (ZOFRAN) IV  4 mg Intravenous Once  . pravastatin  10 mg Oral q1800  . triamterene-hydrochlorothiazide  1 tablet Oral Daily     acetaminophen, diclofenac, hydrALAZINE, morphine injection, ondansetron (ZOFRAN) IV, polyethylene glycol  Prior to Admission medications   Medication Sig Start Date End Date Taking? Authorizing Provider  acetaminophen (TYLENOL) 500 MG tablet Take 1,000 mg by mouth every 8 (eight) hours as needed (pain).   Yes Historical Provider, MD  aspirin 81 MG chewable tablet Chew 324 mg by mouth once.   Yes Historical Provider, MD  lovastatin (MEVACOR) 10 MG tablet Take 10 mg by mouth at bedtime.   Yes Historical Provider, MD  meloxicam (MOBIC) 15 MG tablet Take 15 mg by mouth daily as needed for pain.   Yes Historical Provider, MD  triamterene-hydrochlorothiazide (MAXZIDE-25) 37.5-25 MG per tablet Take 1 tablet by mouth daily.   Yes Historical Provider, MD     Social History   Social History  . Marital Status: Married    Spouse Name: N/A  .  Number of Children: N/A  . Years of Education: N/A   Occupational History  . Not on file.   Social History Main Topics  . Smoking status: Former Smoker    Types: Cigarettes    Quit date: 03/13/1953  . Smokeless tobacco: Never Used  . Alcohol Use: 8.4 oz/week    14 Cans of beer per week     Comment: 1 beers a day     occasional   . Drug Use: No  . Sexual Activity: Not on file   Other Topics Concern  . Not on file   Social History Narrative    Family Status  Relation Status  Death Age  . Mother Deceased   . Father Deceased   . Sister Alive   . Brother Alive   . Daughter Alive   . Son Alive    Family History  Problem Relation Age of Onset  . CAD Brother   . Osteoporosis Daughter        ROS:  Full 14 point review of systems complete and found to be negative unless listed above.  Physical Exam: Blood pressure 145/54, pulse 61, temperature 97.7 F (36.5 C), temperature source Oral, resp. rate 18, height 5\' 10"  (1.778 m), weight 160 lb 4.8 oz (72.712 kg), SpO2 98 %.  General: Well developed, well nourished, male in no acute distress Head: Eyes PERRLA, No xanthomas. Normocephalic and atraumatic, oropharynx without edema or exudate.  Lungs: Resp regular and unlabored, CTA. Heart: RRR no s3, s4, or murmurs.   Neck: No carotid bruits. No lymphadenopathy. No JVD. Abdomen: Bowel sounds present, abdomen soft and non-tender without masses or hernias noted. Msk:  No spine or cva tenderness. No weakness, no joint deformities or effusions. Extremities: No clubbing, cyanosis or edema. DP/PT/Radials 2+ and equal bilaterally. Neuro: Alert and oriented X 3. No focal deficits noted. Psych:  Good affect, responds appropriately Skin: No rashes or lesions noted.  Labs:   Lab Results  Component Value Date   WBC 5.7 06/27/2015   HGB 11.6* 06/27/2015   HCT 35.2* 06/27/2015   MCV 89.8 06/27/2015   PLT 231 06/27/2015   No results for input(s): INR in the last 72 hours.  Recent Labs Lab 06/27/15 0827  NA 136  K 3.9  CL 102  CO2 25  BUN 14  CREATININE 0.78  CALCIUM 9.3  GLUCOSE 91   No results found for: MG  Recent Labs  06/27/15 1219 06/27/15 1425 06/27/15 1730  TROPONINI <0.03 <0.03 <0.03    Recent Labs  06/27/15 0828  TROPIPOC 0.00    Echo: 06/27/15 LV EF: 60% -  65%  ------------------------------------------------------------------- Indications:   Chest pain  786.51.  ------------------------------------------------------------------- History:  Risk factors: Hypertension. Dyslipidemia.  ------------------------------------------------------------------- Study Conclusions  - Left ventricle: The cavity size was normal. Wall thickness was normal. Systolic function was normal. The estimated ejection fraction was in the range of 60% to 65%. Wall motion was normal; there were no regional wall motion abnormalities. Left ventricular diastolic function parameters were normal. - Mitral valve: There was mild regurgitation.   ECG:   Vent. rate 53 BPM PR interval 233 ms QRS duration 116 ms QT/QTc 464/436 ms P-R-T axes 55 31 62  Radiology:  Dg Chest 2 View  06/27/2015  CLINICAL DATA:  Left-sided chest pain. EXAM: CHEST  2 VIEW COMPARISON:  05/13/2014 FINDINGS: The heart size and mediastinal contours are within normal limits. Both lungs are clear. The visualized skeletal structures are unremarkable. IMPRESSION: Normal chest.  Electronically Signed   By: Lorriane Shire M.D.   On: 06/27/2015 08:49   Dg Thoracic Spine 2 View  06/27/2015  CLINICAL DATA:  Thoracic spine pain. EXAM: THORACIC SPINE 2 VIEWS COMPARISON:  Chest x-ray dated 03/13/2014 FINDINGS: Is what appears to be an old compression fracture of T9 with slight anterior wedge deformities of T7 and T8 and the lesser degree of T6. There is diffuse osteopenia. No bone destruction. No widening of the paraspinal line. IMPRESSION: Multiple old slight compression deformities in the mid thoracic spine. I do not think any these abnormalities are acute. Electronically Signed   By: Lorriane Shire M.D.   On: 06/27/2015 10:23   Dg Lumbar Spine Complete  06/27/2015  CLINICAL DATA:  Thoracic back pain EXAM: LUMBAR SPINE - COMPLETE 4+ VIEW COMPARISON:  None. FINDINGS: Osseous demineralization diffusely. Five non-rib-bearing lumbar vertebra. Minimal scattered disc space narrowing and endplate spur formation  greatest at L4-L5. Disc calcification at T12-L1. Vertebral body heights maintained without fracture or subluxation. No bone destruction or spondylolysis. SI joints symmetric. IMPRESSION: Osseous demineralization with degenerative disc disease changes. No acute abnormalities. Electronically Signed   By: Lavonia Dana M.D.   On: 06/27/2015 10:21    ASSESSMENT AND PLAN:     1. Lateral chest pain - His pain is atypical lasted for 3-5 mins and resolved by itself. EKG without acute changes. Trop x 3 negative. Echo reassuring. Currently chest pain free. Differential include MSK vs high BP on admission vs stress.  -No inpatient workup needed. ?outpatient POET.   2. HTN - Was elevated during admission 180/64. Now relatively stable.  - Continue home regimen.  - Consider adding amlodipine continues to have high BP.   3. 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 No syncope of dizziness. Avoid AV blocking agent.  4. HL - Continue statin   Signed: Bhagat,Bhavinkumar, PA 06/28/2015, 8:14 AM  Co-Sign MD   History and all data above reviewed.  Patient examined.  I agree with the findings as above.  The patient has had chest pain that is axillary and under the left arm.  This is dull and came at rest.  It was off and on.   There has been no objective evidence of ischemia since being in the hospital.  The patient exam reveals COR:RRR  ,  Lungs: Clear  ,  Abd: Positive bowel sounds, no rebound no guarding, Ext No edema  .  All available labs, radiology testing, previous records reviewed. Agree with documented assessment and plan. Chest pain:  Atypical.  No objective evidence of ischemia.  No reason for further in patient testing.  No plans for cardiac follow up unless he has recurrent symptoms at which point he could be seen back for consideration of POET (Plain Old Exercise Treadmill)  Sayde Lish  9:22 AM  06/28/2015

## 2015-06-28 NOTE — Discharge Instructions (Signed)
Follow with Primary MD Donnajean Lopes, MD in 7 days   Get CBC, CMP, 2 view Chest X ray checked  by Primary MD next visit.    Activity: As tolerated with Full fall precautions use walker/cane & assistance as needed   Disposition Home    Diet: Heart Healthy  , with feeding assistance and aspiration precautions.  For Heart failure patients - Check your Weight same time everyday, if you gain over 2 pounds, or you develop in leg swelling, experience more shortness of breath or chest pain, call your Primary MD immediately. Follow Cardiac Low Salt Diet and 1.5 lit/day fluid restriction.   On your next visit with your primary care physician please Get Medicines reviewed and adjusted.   Please request your Prim.MD to go over all Hospital Tests and Procedure/Radiological results at the follow up, please get all Hospital records sent to your Prim MD by signing hospital release before you go home.   If you experience worsening of your admission symptoms, develop shortness of breath, life threatening emergency, suicidal or homicidal thoughts you must seek medical attention immediately by calling 911 or calling your MD immediately  if symptoms less severe.  You Must read complete instructions/literature along with all the possible adverse reactions/side effects for all the Medicines you take and that have been prescribed to you. Take any new Medicines after you have completely understood and accpet all the possible adverse reactions/side effects.   Do not drive, operating heavy machinery, perform activities at heights, swimming or participation in water activities or provide baby sitting services if your were admitted for syncope or siezures until you have seen by Primary MD or a Neurologist and advised to do so again.  Do not drive when taking Pain medications.    Do not take more than prescribed Pain, Sleep and Anxiety Medications  Special Instructions: If you have smoked or chewed Tobacco   in the last 2 yrs please stop smoking, stop any regular Alcohol  and or any Recreational drug use.  Wear Seat belts while driving.   Please note  You were cared for by a hospitalist during your hospital stay. If you have any questions about your discharge medications or the care you received while you were in the hospital after you are discharged, you can call the unit and asked to speak with the hospitalist on call if the hospitalist that took care of you is not available. Once you are discharged, your primary care physician will handle any further medical issues. Please note that NO REFILLS for any discharge medications will be authorized once you are discharged, as it is imperative that you return to your primary care physician (or establish a relationship with a primary care physician if you do not have one) for your aftercare needs so that they can reassess your need for medications and monitor your lab values.    Chest Pain Observation It is often hard to give a specific diagnosis for the cause of chest pain. Among other possibilities your symptoms might be caused by inadequate oxygen delivery to your heart (angina). Angina that is not treated or evaluated can lead to a heart attack (myocardial infarction) or death. Blood tests, electrocardiograms, and X-rays may have been done to help determine a possible cause of your chest pain. After evaluation and observation, your health care provider has determined that it is unlikely your pain was caused by an unstable condition that requires hospitalization. However, a full evaluation of your pain may need to  be completed, with additional diagnostic testing as directed. It is very important to keep your follow-up appointments. Not keeping your follow-up appointments could result in permanent heart damage, disability, or death. If there is any problem keeping your follow-up appointments, you must call your health care provider. HOME CARE INSTRUCTIONS    Due to the slight chance that your pain could be angina, it is important to follow your health care provider's treatment plan and also maintain a healthy lifestyle:  Maintain or work toward achieving a healthy weight.  Stay physically active and exercise regularly.  Decrease your salt intake.  Eat a balanced, healthy diet. Talk to a dietitian to learn about heart-healthy foods.  Increase your fiber intake by including whole grains, vegetables, fruits, and nuts in your diet.  Avoid situations that cause stress, anger, or depression.  Take medicines as advised by your health care provider. Report any side effects to your health care provider. Do not stop medicines or adjust the dosages on your own.  Quit smoking. Do not use nicotine patches or gum until you check with your health care provider.  Keep your blood pressure, blood sugar, and cholesterol levels within normal limits.  Limit alcohol intake to no more than 1 drink per day for women who are not pregnant and 2 drinks per day for men.  Do not abuse drugs. SEEK IMMEDIATE MEDICAL CARE IF: You have severe chest pain or pressure which may include symptoms such as:  You feel pain or pressure in your arms, neck, jaw, or back.  You have severe back or abdominal pain, feel sick to your stomach (nauseous), or throw up (vomit).  You are sweating profusely.  You are having a fast or irregular heartbeat.  You feel short of breath while at rest.  You notice increasing shortness of breath during rest, sleep, or with activity.  You have chest pain that does not get better after rest or after taking your usual medicine.  You wake from sleep with chest pain.  You are unable to sleep because you cannot breathe.  You develop a frequent cough or you are coughing up blood.  You feel dizzy, faint, or experience extreme fatigue.  You develop severe weakness, dizziness, fainting, or chills. Any of these symptoms may represent a serious  problem that is an emergency. Do not wait to see if the symptoms will go away. Call your local emergency services (911 in the [place] Do not drive yourself to the hospital. MAKE SURE YOU:  Understand these instructions.  Will watch your condition.  Will get help right away if you are not doing well or get worse.   This information is not intended to replace advice given to you by your health care provider. Make sure you discuss any questions you have with your health care provider.   Document Released: 07/12/2010 Document Revised: 06/14/2013 Document Reviewed: 12/09/2012 Elsevier Interactive Patient Education [employer].

## 2016-02-19 DIAGNOSIS — Z0100 Encounter for examination of eyes and vision without abnormal findings: Secondary | ICD-10-CM | POA: Diagnosis not present

## 2016-02-19 DIAGNOSIS — H524 Presbyopia: Secondary | ICD-10-CM | POA: Diagnosis not present

## 2016-03-28 DIAGNOSIS — Z125 Encounter for screening for malignant neoplasm of prostate: Secondary | ICD-10-CM | POA: Diagnosis not present

## 2016-03-28 DIAGNOSIS — E784 Other hyperlipidemia: Secondary | ICD-10-CM | POA: Diagnosis not present

## 2016-03-28 DIAGNOSIS — I10 Essential (primary) hypertension: Secondary | ICD-10-CM | POA: Diagnosis not present

## 2016-04-07 DIAGNOSIS — Z1389 Encounter for screening for other disorder: Secondary | ICD-10-CM | POA: Diagnosis not present

## 2016-04-07 DIAGNOSIS — Z6825 Body mass index (BMI) 25.0-25.9, adult: Secondary | ICD-10-CM | POA: Diagnosis not present

## 2016-04-07 DIAGNOSIS — E784 Other hyperlipidemia: Secondary | ICD-10-CM | POA: Diagnosis not present

## 2016-04-07 DIAGNOSIS — Z0000 Encounter for general adult medical examination without abnormal findings: Secondary | ICD-10-CM | POA: Diagnosis not present

## 2016-04-07 DIAGNOSIS — H6123 Impacted cerumen, bilateral: Secondary | ICD-10-CM | POA: Diagnosis not present

## 2016-04-07 DIAGNOSIS — Z23 Encounter for immunization: Secondary | ICD-10-CM | POA: Diagnosis not present

## 2016-04-07 DIAGNOSIS — M25552 Pain in left hip: Secondary | ICD-10-CM | POA: Diagnosis not present

## 2016-04-07 DIAGNOSIS — E871 Hypo-osmolality and hyponatremia: Secondary | ICD-10-CM | POA: Diagnosis not present

## 2016-04-07 DIAGNOSIS — I10 Essential (primary) hypertension: Secondary | ICD-10-CM | POA: Diagnosis not present

## 2016-10-02 DIAGNOSIS — R300 Dysuria: Secondary | ICD-10-CM | POA: Diagnosis not present

## 2016-10-03 DIAGNOSIS — R5381 Other malaise: Secondary | ICD-10-CM | POA: Diagnosis not present

## 2016-10-03 DIAGNOSIS — R110 Nausea: Secondary | ICD-10-CM | POA: Diagnosis not present

## 2016-10-03 DIAGNOSIS — Z6824 Body mass index (BMI) 24.0-24.9, adult: Secondary | ICD-10-CM | POA: Diagnosis not present

## 2016-10-03 DIAGNOSIS — R300 Dysuria: Secondary | ICD-10-CM | POA: Diagnosis not present

## 2016-10-03 DIAGNOSIS — H9193 Unspecified hearing loss, bilateral: Secondary | ICD-10-CM | POA: Diagnosis not present

## 2016-10-03 DIAGNOSIS — H6123 Impacted cerumen, bilateral: Secondary | ICD-10-CM | POA: Diagnosis not present

## 2016-10-03 DIAGNOSIS — R05 Cough: Secondary | ICD-10-CM | POA: Diagnosis not present

## 2017-04-06 DIAGNOSIS — I10 Essential (primary) hypertension: Secondary | ICD-10-CM | POA: Diagnosis not present

## 2017-04-06 DIAGNOSIS — Z125 Encounter for screening for malignant neoplasm of prostate: Secondary | ICD-10-CM | POA: Diagnosis not present

## 2017-04-06 DIAGNOSIS — Z0000 Encounter for general adult medical examination without abnormal findings: Secondary | ICD-10-CM | POA: Diagnosis not present

## 2017-04-06 DIAGNOSIS — E7849 Other hyperlipidemia: Secondary | ICD-10-CM | POA: Diagnosis not present

## 2017-04-13 DIAGNOSIS — I10 Essential (primary) hypertension: Secondary | ICD-10-CM | POA: Diagnosis not present

## 2017-04-13 DIAGNOSIS — Z23 Encounter for immunization: Secondary | ICD-10-CM | POA: Diagnosis not present

## 2017-04-13 DIAGNOSIS — Z0000 Encounter for general adult medical examination without abnormal findings: Secondary | ICD-10-CM | POA: Diagnosis not present

## 2017-04-13 DIAGNOSIS — Z6824 Body mass index (BMI) 24.0-24.9, adult: Secondary | ICD-10-CM | POA: Diagnosis not present

## 2017-04-13 DIAGNOSIS — Z1389 Encounter for screening for other disorder: Secondary | ICD-10-CM | POA: Diagnosis not present

## 2017-04-13 DIAGNOSIS — E7849 Other hyperlipidemia: Secondary | ICD-10-CM | POA: Diagnosis not present

## 2017-04-13 DIAGNOSIS — M545 Low back pain: Secondary | ICD-10-CM | POA: Diagnosis not present

## 2017-04-13 DIAGNOSIS — M19049 Primary osteoarthritis, unspecified hand: Secondary | ICD-10-CM | POA: Diagnosis not present

## 2017-04-27 DIAGNOSIS — Z1212 Encounter for screening for malignant neoplasm of rectum: Secondary | ICD-10-CM | POA: Diagnosis not present

## 2017-09-01 IMAGING — DX DG THORACIC SPINE 2V
3 series · 3 of 3 positions shown · non-contrast
Comparison: Chest x-ray dated 03/13/2014

CLINICAL DATA: Thoracic spine pain.

EXAM:
THORACIC SPINE 2 VIEWS

[t-spine ap]
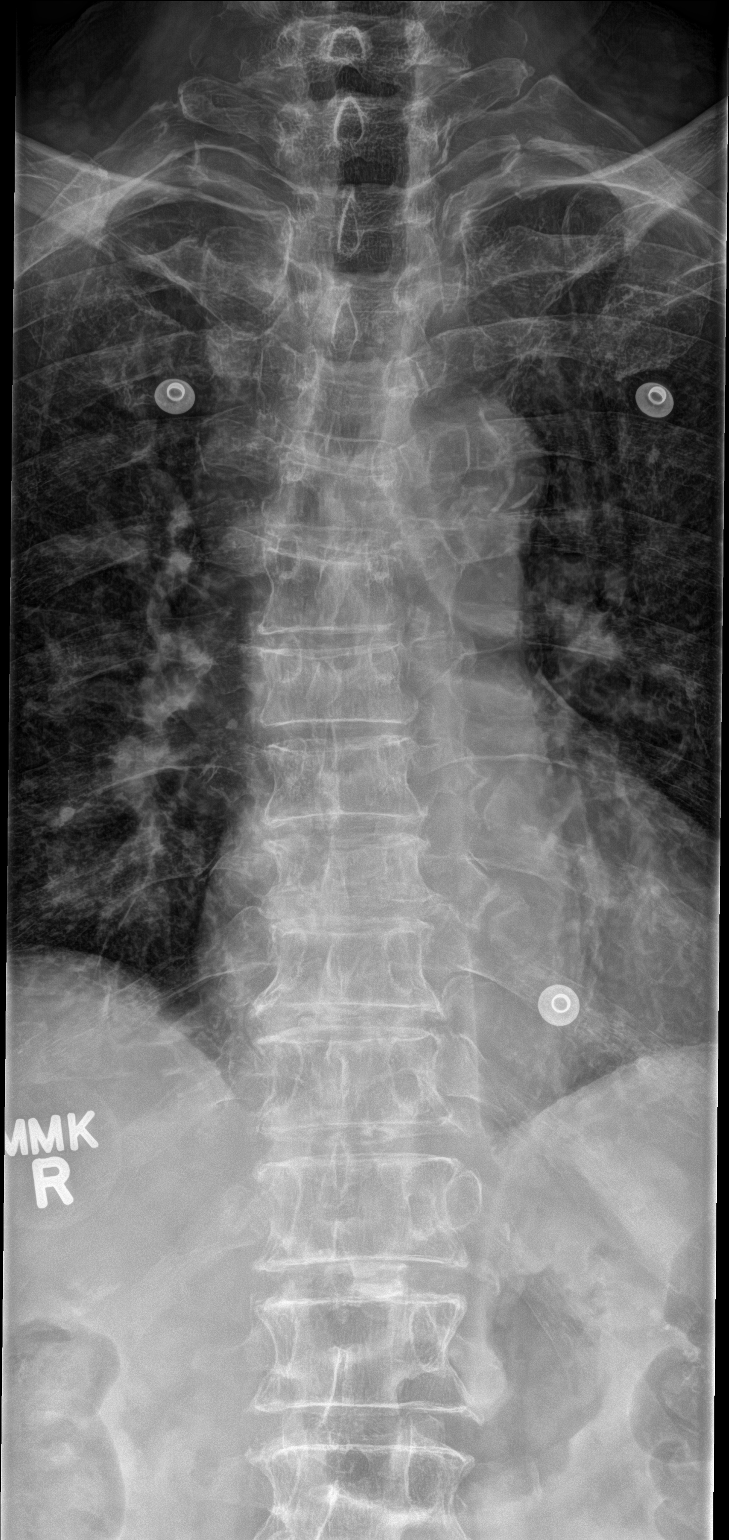

[t-spine lat]
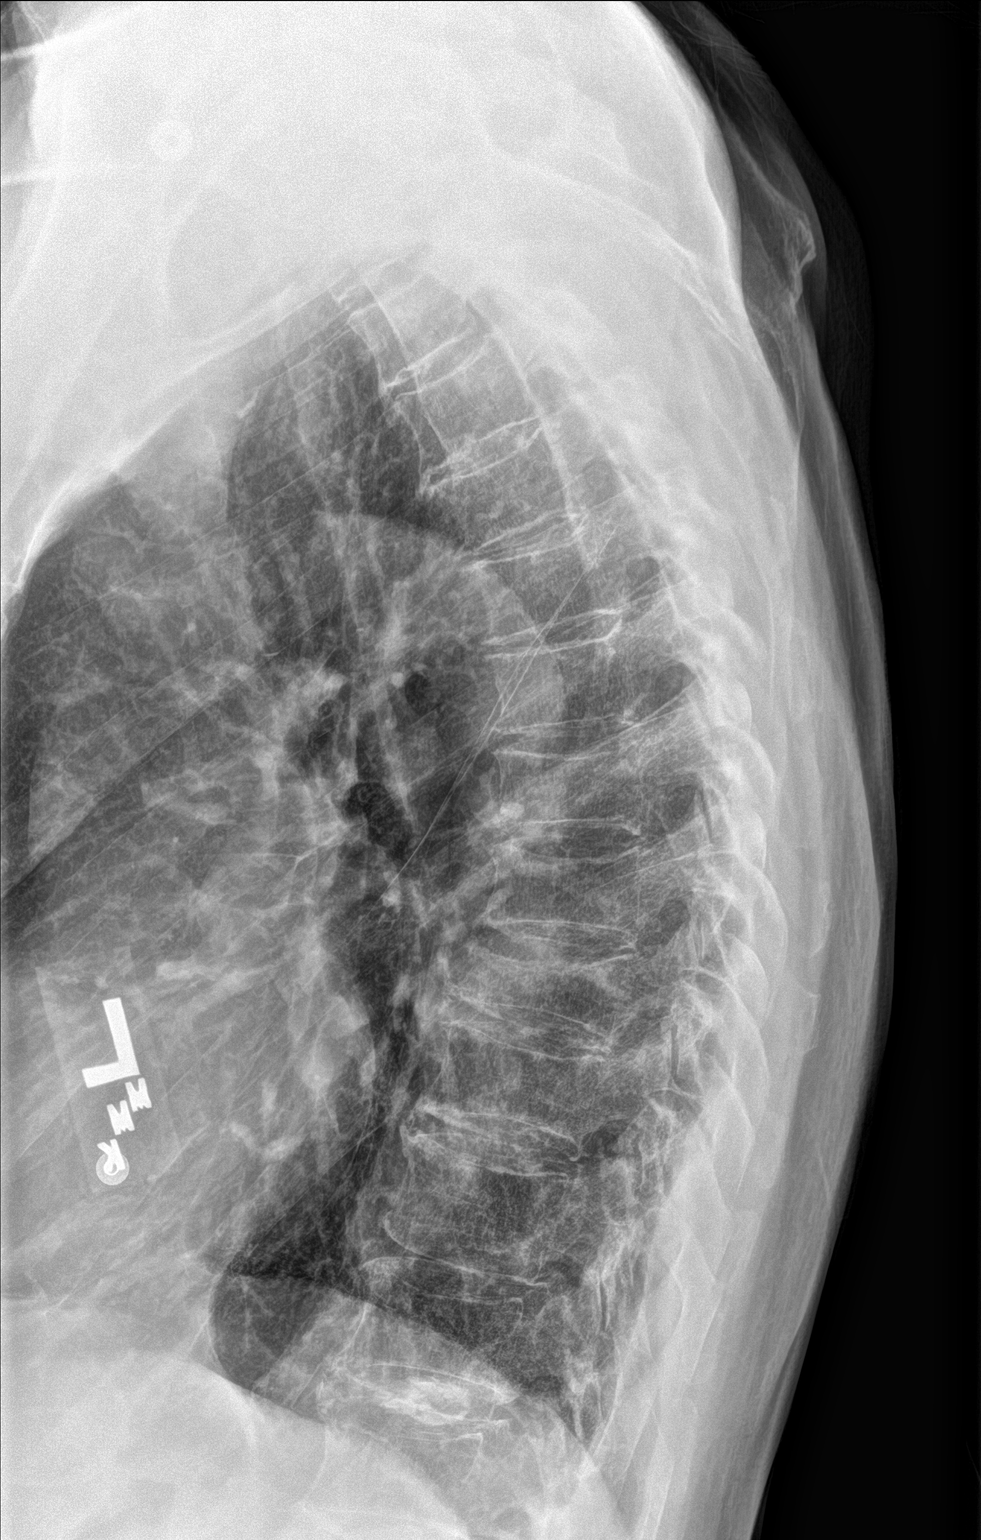

[t-spine swimmers]
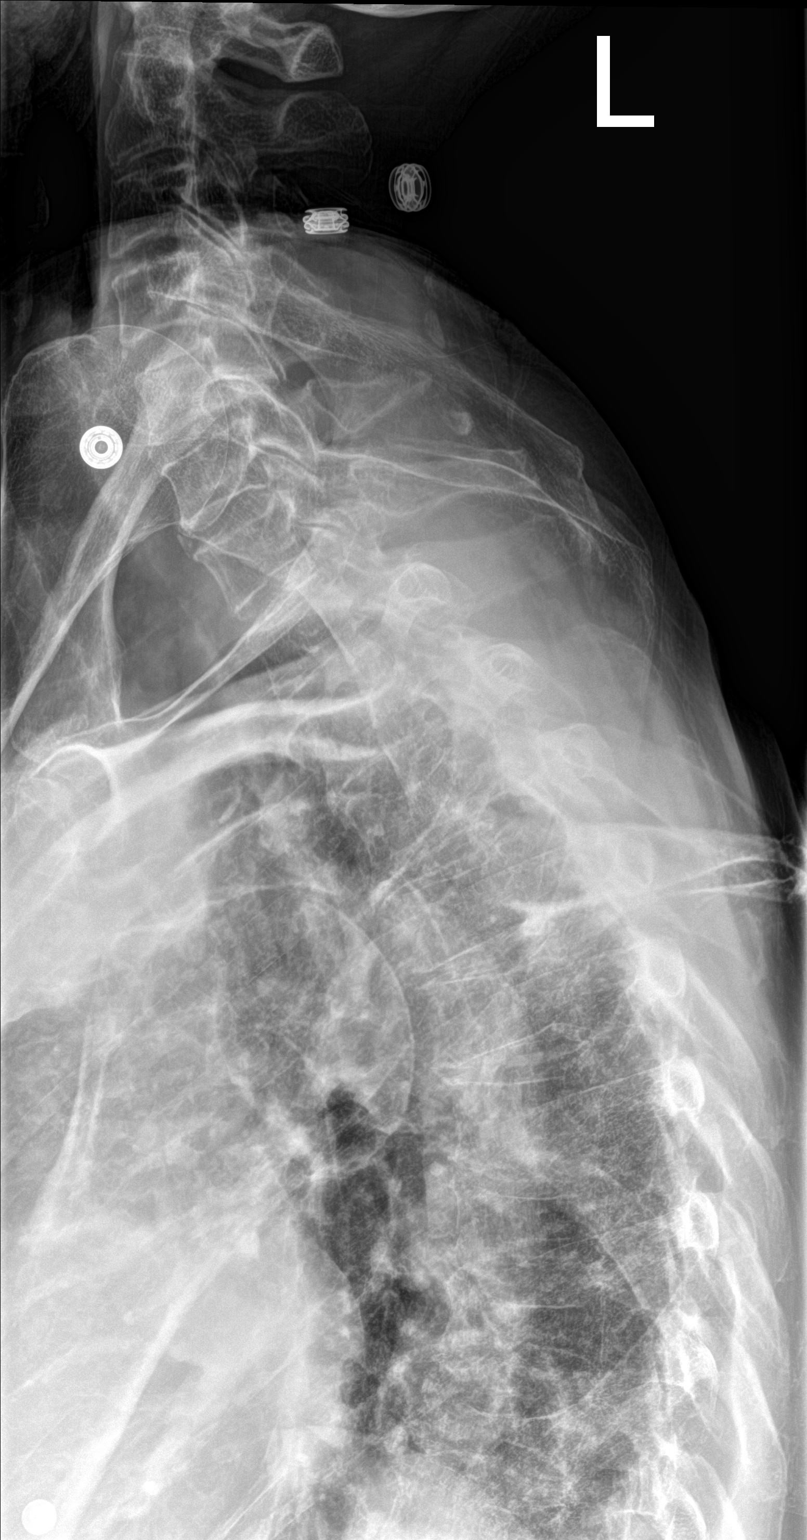

[3 of 3 positions shown; findings below may reference images not displayed]

FINDINGS: Is what appears to be an old compression fracture of T9 with slight
anterior wedge deformities of T7 and T8 and the lesser degree of T6.
There is diffuse osteopenia. No bone destruction. No widening of the
paraspinal line.
IMPRESSION: Multiple old slight compression deformities in the mid thoracic
spine. I do not think any these abnormalities are acute.

## 2017-12-07 DIAGNOSIS — Z6824 Body mass index (BMI) 24.0-24.9, adult: Secondary | ICD-10-CM | POA: Diagnosis not present

## 2017-12-07 DIAGNOSIS — I872 Venous insufficiency (chronic) (peripheral): Secondary | ICD-10-CM | POA: Diagnosis not present

## 2017-12-07 DIAGNOSIS — M7062 Trochanteric bursitis, left hip: Secondary | ICD-10-CM | POA: Diagnosis not present

## 2017-12-07 DIAGNOSIS — M25572 Pain in left ankle and joints of left foot: Secondary | ICD-10-CM | POA: Diagnosis not present

## 2017-12-07 DIAGNOSIS — M25462 Effusion, left knee: Secondary | ICD-10-CM | POA: Diagnosis not present

## 2017-12-16 DIAGNOSIS — M25562 Pain in left knee: Secondary | ICD-10-CM | POA: Diagnosis not present

## 2017-12-29 DIAGNOSIS — R69 Illness, unspecified: Secondary | ICD-10-CM | POA: Diagnosis not present

## 2017-12-29 DIAGNOSIS — I10 Essential (primary) hypertension: Secondary | ICD-10-CM | POA: Diagnosis not present

## 2017-12-29 DIAGNOSIS — M1990 Unspecified osteoarthritis, unspecified site: Secondary | ICD-10-CM | POA: Diagnosis not present

## 2017-12-29 DIAGNOSIS — Z809 Family history of malignant neoplasm, unspecified: Secondary | ICD-10-CM | POA: Diagnosis not present

## 2017-12-29 DIAGNOSIS — E785 Hyperlipidemia, unspecified: Secondary | ICD-10-CM | POA: Diagnosis not present

## 2017-12-29 DIAGNOSIS — M62838 Other muscle spasm: Secondary | ICD-10-CM | POA: Diagnosis not present

## 2017-12-29 DIAGNOSIS — G8929 Other chronic pain: Secondary | ICD-10-CM | POA: Diagnosis not present

## 2017-12-29 DIAGNOSIS — Z8249 Family history of ischemic heart disease and other diseases of the circulatory system: Secondary | ICD-10-CM | POA: Diagnosis not present

## 2017-12-29 DIAGNOSIS — Z791 Long term (current) use of non-steroidal anti-inflammatories (NSAID): Secondary | ICD-10-CM | POA: Diagnosis not present

## 2018-01-15 DIAGNOSIS — Z6824 Body mass index (BMI) 24.0-24.9, adult: Secondary | ICD-10-CM | POA: Diagnosis not present

## 2018-01-15 DIAGNOSIS — I10 Essential (primary) hypertension: Secondary | ICD-10-CM | POA: Diagnosis not present

## 2018-01-15 DIAGNOSIS — M545 Low back pain: Secondary | ICD-10-CM | POA: Diagnosis not present

## 2018-01-15 DIAGNOSIS — R5383 Other fatigue: Secondary | ICD-10-CM | POA: Diagnosis not present

## 2018-01-15 DIAGNOSIS — M25562 Pain in left knee: Secondary | ICD-10-CM | POA: Diagnosis not present

## 2018-02-04 DIAGNOSIS — R5383 Other fatigue: Secondary | ICD-10-CM | POA: Diagnosis not present

## 2018-02-04 DIAGNOSIS — I10 Essential (primary) hypertension: Secondary | ICD-10-CM | POA: Diagnosis not present

## 2018-02-04 DIAGNOSIS — Z6824 Body mass index (BMI) 24.0-24.9, adult: Secondary | ICD-10-CM | POA: Diagnosis not present

## 2018-02-04 DIAGNOSIS — I872 Venous insufficiency (chronic) (peripheral): Secondary | ICD-10-CM | POA: Diagnosis not present

## 2018-02-04 DIAGNOSIS — E871 Hypo-osmolality and hyponatremia: Secondary | ICD-10-CM | POA: Diagnosis not present

## 2018-03-16 DIAGNOSIS — H524 Presbyopia: Secondary | ICD-10-CM | POA: Diagnosis not present

## 2018-04-12 DIAGNOSIS — Z125 Encounter for screening for malignant neoplasm of prostate: Secondary | ICD-10-CM | POA: Diagnosis not present

## 2018-04-12 DIAGNOSIS — E7849 Other hyperlipidemia: Secondary | ICD-10-CM | POA: Diagnosis not present

## 2018-04-12 DIAGNOSIS — I10 Essential (primary) hypertension: Secondary | ICD-10-CM | POA: Diagnosis not present

## 2018-04-19 DIAGNOSIS — M25572 Pain in left ankle and joints of left foot: Secondary | ICD-10-CM | POA: Diagnosis not present

## 2018-04-19 DIAGNOSIS — Z6824 Body mass index (BMI) 24.0-24.9, adult: Secondary | ICD-10-CM | POA: Diagnosis not present

## 2018-04-19 DIAGNOSIS — Z1389 Encounter for screening for other disorder: Secondary | ICD-10-CM | POA: Diagnosis not present

## 2018-04-19 DIAGNOSIS — M25562 Pain in left knee: Secondary | ICD-10-CM | POA: Diagnosis not present

## 2018-04-19 DIAGNOSIS — Z23 Encounter for immunization: Secondary | ICD-10-CM | POA: Diagnosis not present

## 2018-04-19 DIAGNOSIS — Z0000 Encounter for general adult medical examination without abnormal findings: Secondary | ICD-10-CM | POA: Diagnosis not present

## 2018-04-19 DIAGNOSIS — E7849 Other hyperlipidemia: Secondary | ICD-10-CM | POA: Diagnosis not present

## 2018-04-19 DIAGNOSIS — I10 Essential (primary) hypertension: Secondary | ICD-10-CM | POA: Diagnosis not present

## 2018-04-19 DIAGNOSIS — M25552 Pain in left hip: Secondary | ICD-10-CM | POA: Diagnosis not present

## 2018-04-19 DIAGNOSIS — M545 Low back pain: Secondary | ICD-10-CM | POA: Diagnosis not present

## 2018-07-11 ENCOUNTER — Emergency Department (HOSPITAL_COMMUNITY): Payer: Medicare HMO

## 2018-07-11 ENCOUNTER — Encounter (HOSPITAL_COMMUNITY): Payer: Self-pay

## 2018-07-11 ENCOUNTER — Emergency Department (HOSPITAL_COMMUNITY)
Admission: EM | Admit: 2018-07-11 | Discharge: 2018-07-11 | Disposition: A | Discharge status: Home / Self Care | Payer: Medicare HMO | Attending: Emergency Medicine | Admitting: Emergency Medicine

## 2018-07-11 DIAGNOSIS — I10 Essential (primary) hypertension: Secondary | ICD-10-CM | POA: Diagnosis not present

## 2018-07-11 DIAGNOSIS — R109 Unspecified abdominal pain: Secondary | ICD-10-CM | POA: Diagnosis not present

## 2018-07-11 DIAGNOSIS — K7689 Other specified diseases of liver: Secondary | ICD-10-CM | POA: Diagnosis not present

## 2018-07-11 DIAGNOSIS — E860 Dehydration: Secondary | ICD-10-CM | POA: Diagnosis not present

## 2018-07-11 DIAGNOSIS — E871 Hypo-osmolality and hyponatremia: Secondary | ICD-10-CM

## 2018-07-11 DIAGNOSIS — I440 Atrioventricular block, first degree: Secondary | ICD-10-CM | POA: Diagnosis not present

## 2018-07-11 DIAGNOSIS — R0981 Nasal congestion: Secondary | ICD-10-CM | POA: Diagnosis not present

## 2018-07-11 DIAGNOSIS — R05 Cough: Secondary | ICD-10-CM | POA: Insufficient documentation

## 2018-07-11 DIAGNOSIS — R001 Bradycardia, unspecified: Secondary | ICD-10-CM | POA: Diagnosis not present

## 2018-07-11 DIAGNOSIS — R197 Diarrhea, unspecified: Secondary | ICD-10-CM | POA: Diagnosis not present

## 2018-07-11 DIAGNOSIS — Z79899 Other long term (current) drug therapy: Secondary | ICD-10-CM | POA: Insufficient documentation

## 2018-07-11 DIAGNOSIS — R55 Syncope and collapse: Secondary | ICD-10-CM

## 2018-07-11 DIAGNOSIS — N281 Cyst of kidney, acquired: Secondary | ICD-10-CM | POA: Diagnosis not present

## 2018-07-11 LAB — COMPREHENSIVE METABOLIC PANEL
ALBUMIN: 3.9 g/dL (ref 3.5–5.0)
ALT: 21 U/L (ref 0–44)
ANION GAP: 8 (ref 5–15)
AST: 27 U/L (ref 15–41)
Alkaline Phosphatase: 41 U/L (ref 38–126)
BILIRUBIN TOTAL: 0.5 mg/dL (ref 0.3–1.2)
BUN: 14 mg/dL (ref 8–23)
CHLORIDE: 96 mmol/L — AB (ref 98–111)
CO2: 24 mmol/L (ref 22–32)
Calcium: 8.9 mg/dL (ref 8.9–10.3)
Creatinine, Ser: 0.87 mg/dL (ref 0.61–1.24)
GFR calc Af Amer: >60 mL/min (ref >60)
GFR calc non Af Amer: >60 mL/min (ref >60)
GLUCOSE: 106 mg/dL — AB (ref 70–99)
POTASSIUM: 4.5 mmol/L (ref 3.5–5.1)
SODIUM: 128 mmol/L — AB (ref 135–145)
Total Protein: 6.4 g/dL — ABNORMAL LOW (ref 6.5–8.1)

## 2018-07-11 LAB — CBC WITH DIFFERENTIAL/PLATELET
ABS IMMATURE GRANULOCYTES: 0.07 10*3/uL (ref 0.00–0.07)
Basophils Absolute: 0.1 10*3/uL (ref 0.0–0.1)
Basophils Relative: 1 %
Eosinophils Absolute: 0.2 10*3/uL (ref 0.0–0.5)
Eosinophils Relative: 2 %
HCT: 39.4 % (ref 39.0–52.0)
Hemoglobin: 12.4 g/dL — ABNORMAL LOW (ref 13.0–17.0)
IMMATURE GRANULOCYTES: 1 %
LYMPHS ABS: 1.4 10*3/uL (ref 0.7–4.0)
Lymphocytes Relative: 14 %
MCH: 29.1 pg (ref 26.0–34.0)
MCHC: 31.5 g/dL (ref 30.0–36.0)
MCV: 92.5 fL (ref 80.0–100.0)
MONO ABS: 0.8 10*3/uL (ref 0.1–1.0)
MONOS PCT: 8 %
NEUTROS ABS: 7.4 10*3/uL (ref 1.7–7.7)
NEUTROS PCT: 74 %
Platelets: 259 10*3/uL (ref 150–400)
RBC: 4.26 MIL/uL (ref 4.22–5.81)
RDW: 12.3 % (ref 11.5–15.5)
WBC: 9.9 10*3/uL (ref 4.0–10.5)
nRBC: 0 % (ref 0.0–0.2)

## 2018-07-11 LAB — I-STAT TROPONIN, ED: TROPONIN I, POC: 0 ng/mL (ref 0.00–0.08)

## 2018-07-11 MED ORDER — SODIUM CHLORIDE 0.9 % IV BOLUS
500.0000 mL | Freq: Once | INTRAVENOUS | Status: AC
  Administered 2018-07-11: 500 mL via INTRAVENOUS

## 2018-07-11 MED ORDER — IOHEXOL 300 MG/ML  SOLN
100.0000 mL | Freq: Once | INTRAMUSCULAR | Status: AC | PRN
  Administered 2018-07-11: 100 mL via INTRAVENOUS

## 2018-07-11 NOTE — ED Notes (Signed)
ED Provider at bedside. 

## 2018-07-11 NOTE — ED Notes (Signed)
Family at bedside. 

## 2018-07-11 NOTE — ED Provider Notes (Signed)
[place] EMERGENCY DEPARTMENT Provider Note   CSN: [id number] Arrival date & time: 07/11/18  1045     History   Chief Complaint Chief Complaint  Patient presents with  . Near Syncope    HPI Clayton Odonnell is a 85 y.o. male.  The history is provided by the patient and medical records. No language interpreter was used.  Near Syncope    Clayton Odonnell is a 85 y.o. male who presents to the Emergency Department complaining of near syncope. He presents to the emergency department by EMS for evaluation following a near syncopal episode that occurred earlier today. He has been ill recently for the last 10 days with nasal congestion and cough. This morning he developed abdominal discomfort with two episodes of loose and runny stools. Following the second episode he had generalized weakness and felt as if he might pass out. He did have associated diaphoresis. On EMS arrival he was noted to be pale. He denies any hematochezia or melena. No fevers, vomiting, chest pain, leg swelling or pain. He lives at home alone. He is unsure if he has had similar symptoms in the past. Past Medical History:  Diagnosis Date  . Hyperlipemia   . Hypertension   . Pneumonia   . Transfusion of blood product refused for religious reason     Patient Active Problem List   Diagnosis Date Noted  . Chest pain 06/27/2015  . Hyperlipidemia 06/27/2015  . Upper back pain on right side 06/27/2015  . Stress at home 06/27/2015  . Pain in the chest   . Chronic constipation 03/17/2014  . Essential hypertension, benign 03/14/2014  . Patient is Jehovah's Witness 03/14/2014  . Other and unspecified hyperlipidemia 03/14/2014  . Closed left hip fracture ([place]) 03/13/2014    Past Surgical History:  Procedure Laterality Date  . FEMUR IM NAIL Left 03/14/2014   Procedure: INTRAMEDULLARY (IM) NAIL FEMORAL;  Surgeon: Renette Butters, MD;  Location: [facility];  Service: Orthopedics;  Laterality:  Left;  . TONSILLECTOMY          Home Medications    Prior to Admission medications   Medication Sig Start Date End Date Taking? Authorizing Provider  acetaminophen (TYLENOL) 500 MG tablet Take 1,000 mg by mouth every 8 (eight) hours as needed (pain).   Yes [provider]  losartan (COZAAR) 50 MG tablet Take 50 mg by mouth daily.   Yes [provider]  lovastatin (MEVACOR) 10 MG tablet Take 10 mg by mouth at bedtime.   Yes [provider]  meloxicam (MOBIC) 15 MG tablet Take 15 mg by mouth daily as needed for pain.   Yes [provider]    Family History Family History  Problem Relation Age of Onset  . CAD Brother   . Osteoporosis Daughter     Social History Social History   Tobacco Use  . Smoking status: Former Smoker    Types: Cigarettes    Last attempt to quit: 03/13/1953    Years since quitting: 65.3  . Smokeless tobacco: Never Used  Substance Use Topics  . Alcohol use: Yes    Alcohol/week: 14.0 standard drinks    Types: 14 Cans of beer per week  . Drug use: No     Allergies   Tetanus toxoids and Penicillins   Review of Systems Review of Systems  Cardiovascular: Positive for near-syncope.  All other systems reviewed and are negative.    Physical Exam Updated Vital Signs BP Marland Kitchen)  167/62 (BP Location: Right Arm)   Pulse (!) 55   Temp 98.6 F (37 C) (Oral)   Resp 18   SpO2 99%   Physical Exam Vitals signs and nursing note reviewed.  Constitutional:      Appearance: He is well-developed.  HENT:     Head: Normocephalic and atraumatic.  Cardiovascular:     Rate and Rhythm: Normal rate and regular rhythm.     Heart sounds: No murmur.  Pulmonary:     Effort: Pulmonary effort is normal. No respiratory distress.     Breath sounds: Normal breath sounds.  Abdominal:     Palpations: Abdomen is soft.     Tenderness: There is no abdominal tenderness. There is no guarding or rebound.  Genitourinary:    Comments:  Nontender rectal exam. Brown stool. Heme negative. Musculoskeletal:        General: No swelling or tenderness.  Skin:    General: Skin is warm and dry.     Capillary Refill: Capillary refill takes less than 2 seconds.  Neurological:     General: No focal deficit present.     Mental Status: He is alert and oriented to person, place, and time.  Psychiatric:        Mood and Affect: Mood normal.        Behavior: Behavior normal.      ED Treatments / Results  Labs (all labs ordered are listed, but only abnormal results are displayed) Labs Reviewed  COMPREHENSIVE METABOLIC PANEL - Abnormal; Notable for the following components:      Result Value   Sodium 128 (*)    Chloride 96 (*)    Glucose, Bld 106 (*)    Total Protein 6.4 (*)    All other components within normal limits  CBC WITH DIFFERENTIAL/PLATELET - Abnormal; Notable for the following components:   Hemoglobin 12.4 (*)    All other components within normal limits  I-STAT TROPONIN, ED    EKG EKG Interpretation  Date/Time:  Sunday July 11 2018 10:50:17 EST Ventricular Rate:  57 PR Interval:    QRS Duration: 105 QT Interval:  466 QTC Calculation: 454 R Axis:   -69 Text Interpretation:  Sinus rhythm Prolonged PR interval LAD, consider left anterior fascicular block RSR' in V1 or V2, right VCD or RVH Confirmed by Quintella Reichert [phone]) on 07/11/2018 11:02:07 AM   Radiology Dg Chest 2 View  Result Date: 07/11/2018 CLINICAL DATA:  Near syncopal episode EXAM: CHEST - 2 VIEW COMPARISON:  June 27, 2015 FINDINGS: There is no appreciable edema or consolidation. There is an area of soft tissue fullness in the medial right base measuring 1.6 x 1.4 cm. Heart size and pulmonary vascularity are normal. No adenopathy. There is aortic atherosclerosis. There is anterior wedging of the T9 vertebral body, stable. IMPRESSION: Ill-defined opacity in the medial right base, not appreciable on previous study. Advise noncontrast enhanced  chest CT to further assess this area. Lungs elsewhere clear. Heart size normal. There is aortic atherosclerosis. Stable anterior wedging at T9. Aortic Atherosclerosis (ICD10-I70.0). Electronically Signed   By: Lowella Grip III M.D.   On: 07/11/2018 12:24   Ct Abdomen Pelvis W Contrast  Result Date: 07/11/2018 CLINICAL DATA:  Diarrhea today.  Abdominal pain. EXAM: CT ABDOMEN AND PELVIS WITH CONTRAST TECHNIQUE: Multidetector CT imaging of the abdomen and pelvis was performed using the standard protocol following bolus administration of intravenous contrast. CONTRAST:  170mL OMNIPAQUE IOHEXOL 300 MG/ML  SOLN COMPARISON:  None. FINDINGS: Lower  chest: Mild dependent atelectasis of posterior lung bases are noted. The heart size is normal. Hepatobiliary: There is a 1.3 cm cyst in the left lobe liver. The liver is otherwise unremarkable. The gallbladder is normal. Biliary tree is normal. Pancreas: Unremarkable. No pancreatic ductal dilatation or surrounding inflammatory changes. Spleen: Normal in size without focal abnormality. Adrenals/Urinary Tract: The bilateral adrenal glands are normal. There is a simple cyst in the upper pole right kidney. There is no hydronephrosis bilaterally. Bilateral parapelvic renal cysts are identified. Bladder is normal. Stomach/Bowel: Stomach is within normal limits. The appendix is not seen but no inflammation is noted around cecum. No evidence of bowel wall thickening, distention, or inflammatory changes. Vascular/Lymphatic: Aortic atherosclerosis. No enlarged abdominal or pelvic lymph nodes. Reproductive: Prostate is unremarkable. Other: None. Musculoskeletal: Degenerative joint changes of the spine are noted. IMPRESSION: No acute abnormality identified in the abdomen pelvis. There is no evidence of bowel obstruction or diverticulitis. Liver and right kidney cysts. Electronically Signed   By: Abelardo Diesel M.D.   On: 07/11/2018 13:15    Procedures Procedures (including  critical care time)  Medications Ordered in ED Medications  sodium chloride 0.9 % bolus 500 mL (0 mLs Intravenous Stopped 07/11/18 1335)  iohexol (OMNIPAQUE) 300 MG/ML solution 100 mL (100 mLs Intravenous Contrast Given 07/11/18 1229)  sodium chloride 0.9 % bolus 500 mL (0 mLs Intravenous Stopped 07/11/18 1435)     Initial Impression / Assessment and Plan / ED Course  I have reviewed the triage vital signs and the nursing notes.  Pertinent labs & imaging results that were available during my care of the patient were reviewed by me and considered in my medical decision making (see chart for details).     Pt here for evaluation following near syncopal episode while experiencing diarrhea earlier today.  Pt is feeling improved on evaluation in the ED.  No evidence of GI bleed.  He is mildly hyponatremic and orthostatic in the ED.  He was treated with IVF hydration.  Presentation is not c/w ACS, PE, arrhythmia, diverticultis.  CT negative for AAA.  Plan to d/c home with outpatient follow up and return precautions.    Final Clinical Impressions(s) / ED Diagnoses   Final diagnoses:  Near syncope  Dehydration  Hyponatremia    ED Discharge Orders    None       Quintella Reichert, MD 07/11/18 1601

## 2018-07-11 NOTE — ED Notes (Signed)
Patient verbalizes understanding of discharge instructions. Opportunity for questioning and answers were provided. Armband removed by staff, pt discharged from ED. Pt ambulatory to lobby and taken home by family. Pt offered wheelchair to lobby.

## 2018-07-11 NOTE — Discharge Instructions (Addendum)
Your sodium was low today. Please follow-up with your family doctor for recheck. You had a chest x-ray today that was abnormal, please follow-up with your family doctor for further evaluation.

## 2018-07-11 NOTE — ED Notes (Signed)
Patient transported to XR. 

## 2018-07-11 NOTE — ED Triage Notes (Signed)
Pt arrived via [facility]; pt frm hm with c/o near syncopal episode in bathroom with gen wkness; pt had diarrhea twice today, c/o "chest cold" x 1 wk; EMS reports initially pale upon arrival; 161/70, 98% on RA, 53 HR

## 2018-07-13 DIAGNOSIS — R918 Other nonspecific abnormal finding of lung field: Secondary | ICD-10-CM | POA: Diagnosis not present

## 2018-07-13 DIAGNOSIS — E871 Hypo-osmolality and hyponatremia: Secondary | ICD-10-CM | POA: Diagnosis not present

## 2018-07-13 DIAGNOSIS — Z6824 Body mass index (BMI) 24.0-24.9, adult: Secondary | ICD-10-CM | POA: Diagnosis not present

## 2018-07-13 DIAGNOSIS — I10 Essential (primary) hypertension: Secondary | ICD-10-CM | POA: Diagnosis not present

## 2018-07-13 DIAGNOSIS — D649 Anemia, unspecified: Secondary | ICD-10-CM | POA: Diagnosis not present

## 2018-07-13 DIAGNOSIS — R5383 Other fatigue: Secondary | ICD-10-CM | POA: Diagnosis not present

## 2018-07-13 DIAGNOSIS — R110 Nausea: Secondary | ICD-10-CM | POA: Diagnosis not present

## 2018-07-15 ENCOUNTER — Other Ambulatory Visit: Payer: Self-pay | Admitting: Internal Medicine

## 2018-07-15 DIAGNOSIS — R9389 Abnormal findings on diagnostic imaging of other specified body structures: Secondary | ICD-10-CM

## 2018-09-03 ENCOUNTER — Other Ambulatory Visit: Payer: Medicare HMO

## 2018-11-30 DIAGNOSIS — I10 Essential (primary) hypertension: Secondary | ICD-10-CM | POA: Diagnosis not present

## 2018-11-30 DIAGNOSIS — I951 Orthostatic hypotension: Secondary | ICD-10-CM | POA: Diagnosis not present

## 2018-11-30 DIAGNOSIS — R42 Dizziness and giddiness: Secondary | ICD-10-CM | POA: Diagnosis not present

## 2018-12-07 ENCOUNTER — Inpatient Hospital Stay (HOSPITAL_COMMUNITY)
Admission: EM | Admit: 2018-12-07 | Discharge: 2018-12-09 | DRG: 065 | Disposition: A | Discharge status: Home Health | Payer: Medicare HMO | Attending: Internal Medicine | Admitting: Internal Medicine

## 2018-12-07 ENCOUNTER — Encounter (HOSPITAL_COMMUNITY): Payer: Self-pay | Admitting: Emergency Medicine

## 2018-12-07 ENCOUNTER — Other Ambulatory Visit: Payer: Self-pay

## 2018-12-07 ENCOUNTER — Emergency Department (HOSPITAL_COMMUNITY): Payer: Medicare HMO

## 2018-12-07 DIAGNOSIS — H538 Other visual disturbances: Secondary | ICD-10-CM | POA: Diagnosis not present

## 2018-12-07 DIAGNOSIS — Z79899 Other long term (current) drug therapy: Secondary | ICD-10-CM

## 2018-12-07 DIAGNOSIS — I10 Essential (primary) hypertension: Secondary | ICD-10-CM | POA: Diagnosis present

## 2018-12-07 DIAGNOSIS — Z1159 Encounter for screening for other viral diseases: Secondary | ICD-10-CM

## 2018-12-07 DIAGNOSIS — D320 Benign neoplasm of cerebral meninges: Secondary | ICD-10-CM | POA: Diagnosis not present

## 2018-12-07 DIAGNOSIS — R29810 Facial weakness: Secondary | ICD-10-CM | POA: Diagnosis not present

## 2018-12-07 DIAGNOSIS — R29701 NIHSS score 1: Secondary | ICD-10-CM | POA: Diagnosis present

## 2018-12-07 DIAGNOSIS — I639 Cerebral infarction, unspecified: Secondary | ICD-10-CM | POA: Diagnosis present

## 2018-12-07 DIAGNOSIS — D352 Benign neoplasm of pituitary gland: Secondary | ICD-10-CM

## 2018-12-07 DIAGNOSIS — G459 Transient cerebral ischemic attack, unspecified: Secondary | ICD-10-CM | POA: Diagnosis not present

## 2018-12-07 DIAGNOSIS — I6522 Occlusion and stenosis of left carotid artery: Secondary | ICD-10-CM

## 2018-12-07 DIAGNOSIS — R531 Weakness: Secondary | ICD-10-CM | POA: Diagnosis not present

## 2018-12-07 DIAGNOSIS — Z87891 Personal history of nicotine dependence: Secondary | ICD-10-CM

## 2018-12-07 DIAGNOSIS — E785 Hyperlipidemia, unspecified: Secondary | ICD-10-CM | POA: Diagnosis present

## 2018-12-07 DIAGNOSIS — Z66 Do not resuscitate: Secondary | ICD-10-CM | POA: Diagnosis present

## 2018-12-07 DIAGNOSIS — R9431 Abnormal electrocardiogram [ECG] [EKG]: Secondary | ICD-10-CM

## 2018-12-07 DIAGNOSIS — Z20828 Contact with and (suspected) exposure to other viral communicable diseases: Secondary | ICD-10-CM | POA: Diagnosis not present

## 2018-12-07 DIAGNOSIS — I6381 Other cerebral infarction due to occlusion or stenosis of small artery: Principal | ICD-10-CM | POA: Diagnosis present

## 2018-12-07 DIAGNOSIS — E871 Hypo-osmolality and hyponatremia: Secondary | ICD-10-CM | POA: Diagnosis present

## 2018-12-07 DIAGNOSIS — D329 Benign neoplasm of meninges, unspecified: Secondary | ICD-10-CM

## 2018-12-07 LAB — COMPREHENSIVE METABOLIC PANEL
ALT: 18 U/L (ref 0–44)
AST: 30 U/L (ref 15–41)
Albumin: 4 g/dL (ref 3.5–5.0)
Alkaline Phosphatase: 35 U/L — ABNORMAL LOW (ref 38–126)
Anion gap: 7 (ref 5–15)
BUN: 16 mg/dL (ref 8–23)
CO2: 26 mmol/L (ref 22–32)
Calcium: 9.4 mg/dL (ref 8.9–10.3)
Chloride: 97 mmol/L — ABNORMAL LOW (ref 98–111)
Creatinine, Ser: 0.96 mg/dL (ref 0.61–1.24)
GFR calc Af Amer: >60 mL/min (ref >60)
GFR calc non Af Amer: >60 mL/min (ref >60)
Glucose, Bld: 103 mg/dL — ABNORMAL HIGH (ref 70–99)
Potassium: 4.7 mmol/L (ref 3.5–5.1)
Sodium: 130 mmol/L — ABNORMAL LOW (ref 135–145)
Total Bilirubin: 0.9 mg/dL (ref 0.3–1.2)
Total Protein: 6.5 g/dL (ref 6.5–8.1)

## 2018-12-07 LAB — CBC WITH DIFFERENTIAL/PLATELET
Abs Immature Granulocytes: 0.01 10*3/uL (ref 0.00–0.07)
Basophils Absolute: 0.1 10*3/uL (ref 0.0–0.1)
Basophils Relative: 1 %
Eosinophils Absolute: 0.2 10*3/uL (ref 0.0–0.5)
Eosinophils Relative: 3 %
HCT: 36 % — ABNORMAL LOW (ref 39.0–52.0)
Hemoglobin: 11.8 g/dL — ABNORMAL LOW (ref 13.0–17.0)
Immature Granulocytes: 0 %
Lymphocytes Relative: 27 %
Lymphs Abs: 1.7 10*3/uL (ref 0.7–4.0)
MCH: 29.9 pg (ref 26.0–34.0)
MCHC: 32.8 g/dL (ref 30.0–36.0)
MCV: 91.1 fL (ref 80.0–100.0)
Monocytes Absolute: 0.7 10*3/uL (ref 0.1–1.0)
Monocytes Relative: 12 %
Neutro Abs: 3.7 10*3/uL (ref 1.7–7.7)
Neutrophils Relative %: 57 %
Platelets: 216 10*3/uL (ref 150–400)
RBC: 3.95 MIL/uL — ABNORMAL LOW (ref 4.22–5.81)
RDW: 12.7 % (ref 11.5–15.5)
WBC: 6.5 10*3/uL (ref 4.0–10.5)
nRBC: 0 % (ref 0.0–0.2)

## 2018-12-07 MED ORDER — IOHEXOL 350 MG/ML SOLN
75.0000 mL | Freq: Once | INTRAVENOUS | Status: AC | PRN
  Administered 2018-12-07: 75 mL via INTRAVENOUS

## 2018-12-07 NOTE — ED Triage Notes (Signed)
[facility]- pt arrives from home. Pt complains around 730 tonight of blurred vision and numbness to the left hand and face. Symptoms resolved before EMS arrived. EMS reports a negative stroke screen.   170/84 BP 60 HR 114 CBG 16 RR

## 2018-12-07 NOTE — ED Notes (Signed)
Patient transported to CT 

## 2018-12-07 NOTE — ED Provider Notes (Signed)
[facility] EMERGENCY DEPARTMENT Provider Note   CSN: [id number] Arrival date & time: 12/07/18  2145    History   Chief Complaint No chief complaint on file.   HPI Clayton Odonnell is a 85 y.o. male.     HPI Patient presents with headache vision changes and left-sided numbness.  Normal at 730 and then has started with blurred vision in left eye.  States his blurred vision is had for years that will come and go.  States he has been told in the past it was either a migraine or tumor but did not have more extensive work-up.  He usually does not get the numbness to accompany with it.  States it felt just more numb on the left side.  Particularly hand and face.  States that headache vision change improving as is the numbness but the numbness is still somewhat better.  No chest pain.  No trouble breathing.  No confusion. Past Medical History:  Diagnosis Date  . Hyperlipemia   . Hypertension   . Pneumonia   . Transfusion of blood product refused for religious reason     Patient Active Problem List   Diagnosis Date Noted  . Chest pain 06/27/2015  . Hyperlipidemia 06/27/2015  . Upper back pain on right side 06/27/2015  . Stress at home 06/27/2015  . Pain in the chest   . Chronic constipation 03/17/2014  . Essential hypertension, benign 03/14/2014  . Patient is Jehovah's Witness 03/14/2014  . Other and unspecified hyperlipidemia 03/14/2014  . Closed left hip fracture ([place]) 03/13/2014    Past Surgical History:  Procedure Laterality Date  . FEMUR IM NAIL Left 03/14/2014   Procedure: INTRAMEDULLARY (IM) NAIL FEMORAL;  Surgeon: Renette Butters, MD;  Location: [facility];  Service: Orthopedics;  Laterality: Left;  . TONSILLECTOMY          Home Medications    Prior to Admission medications   Medication Sig Start Date End Date Taking? Authorizing Provider  acetaminophen (TYLENOL) 500 MG tablet Take 1,000 mg by mouth every 8 (eight) hours as needed (pain).     [provider]  losartan (COZAAR) 50 MG tablet Take 50 mg by mouth daily.    [provider]  lovastatin (MEVACOR) 10 MG tablet Take 10 mg by mouth at bedtime.    [provider]  meloxicam (MOBIC) 15 MG tablet Take 15 mg by mouth daily as needed for pain.    [provider]    Family History Family History  Problem Relation Age of Onset  . CAD Brother   . Osteoporosis Daughter     Social History Social History   Tobacco Use  . Smoking status: Former Smoker    Types: Cigarettes    Quit date: 03/13/1953    Years since quitting: 65.7  . Smokeless tobacco: Never Used  Substance Use Topics  . Alcohol use: Yes    Alcohol/week: 14.0 standard drinks    Types: 14 Cans of beer per week  . Drug use: No     Allergies   Tetanus toxoids and Penicillins   Review of Systems Review of Systems  Constitutional: Negative for appetite change.  HENT: Negative for congestion.   Eyes: Positive for visual disturbance.  Respiratory: Negative for shortness of breath.   Gastrointestinal: Negative for abdominal pain.  Genitourinary: Negative for decreased urine volume.  Musculoskeletal: Negative for back pain.  Skin: Negative for wound.  Neurological: Positive for numbness and headaches. Negative for weakness.  Psychiatric/Behavioral: Negative for confusion.     Physical Exam Updated Vital Signs BP (!) 156/77   Pulse (!) 56   Temp 98.2 F (36.8 C) (Oral)   Resp 16   Ht 5\' 9"  (1.753 m)   Wt 71.7 kg   SpO2 99%   BMI 23.33 kg/m   Physical Exam Vitals signs and nursing note reviewed.  Constitutional:      Appearance: Normal appearance.  HENT:     Head: Atraumatic.  Eyes:     Extraocular Movements: Extraocular movements intact.     Pupils: Pupils are equal, round, and reactive to light.     Comments: Visual fields grossly intact by confrontation.  Neck:     Musculoskeletal: Neck supple.  Cardiovascular:     Rate and Rhythm: Regular rhythm.   Pulmonary:     Effort: Pulmonary effort is normal.  Abdominal:     Tenderness: There is abdominal tenderness.  Musculoskeletal:        General: No tenderness.  Neurological:     Mental Status: He is oriented to person, place, and time.     Comments: Paresthesias to left face.  Eye movements intact.  Visual fields intact.  Normal smile.  Strength appears intact in upper and lower extremities.  Finger-nose intact bilaterally.  Equal gross sensation in bilateral upper and lower extremities.      ED Treatments / Results  Labs (all labs ordered are listed, but only abnormal results are displayed) Labs Reviewed  COMPREHENSIVE METABOLIC PANEL - Abnormal; Notable for the following components:      Result Value   Sodium 130 (*)    Chloride 97 (*)    Glucose, Bld 103 (*)    Alkaline Phosphatase 35 (*)    All other components within normal limits  CBC WITH DIFFERENTIAL/PLATELET - Abnormal; Notable for the following components:   RBC 3.95 (*)    Hemoglobin 11.8 (*)    HCT 36.0 (*)    All other components within normal limits    EKG EKG Interpretation  Date/Time:  Tuesday December 07 2018 21:50:18 EDT Ventricular Rate:  61 PR Interval:    QRS Duration: 107 QT Interval:  524 QTC Calculation: 528 R Axis:   -102 Text Interpretation:  Sinus rhythm Short PR interval Right superior axis Prolonged QT interval Confirmed by Davonna Belling [phone]) on 12/07/2018 11:25:20 PM   Radiology No results found.  Procedures Procedures (including critical care time)  Medications Ordered in ED Medications  iohexol (OMNIPAQUE) 350 MG/ML injection 75 mL (has no administration in time range)     Initial Impression / Assessment and Plan / ED Course  I have reviewed the triage vital signs and the nursing notes.  Pertinent labs & imaging results that were available during my care of the patient were reviewed by me and considered in my medical decision making (see chart for details).         Patient with headache visual change and paresthesias.  Has had episodes like this in the past.  With the recurrent nature of it I think it is likely complicated migraine.  However has not been worked up and does have some comorbidities.  Will get CTA and MRI.  Hopefully will be able be discharged home.  Care turned over to Dr. Leonides Schanz.  Outpatient follow-up.  Final Clinical Impressions(s) / ED Diagnoses   Final diagnoses:  Complicated migraine    ED Discharge Orders    None       Davonna Belling,  MD 12/07/18 2326

## 2018-12-07 NOTE — ED Provider Notes (Signed)
11:00 PM  Assumed care from Dr. Alvino Chapel.  Patient is an 85 yo M with h/o HTN and HLD who presents with L vision changes for years and then L sided tingling today starting at 730pm.  Now some L face and hand tingling.  Has L sided HA that has improved.  Has also had these HAs before.  Getting CTA head and neck, MRI brain.  If negative, dc home with neuro follow up.  2:42 AM  Pt's MRI has an acute right thalamic infarct.  Will discuss with neuro and give ASA and admit.  2:48 AM  D/w Dr. Cheral Marker with neurology who will see patient in ED.  Will admit to medicine.  2:55 AM  Pt still complaining of headache.  Request Tylenol for pain.  Still having left face and hand numbness.  Vision changes have resolved.  No weakness.  PCP is Dr. Sharlett Iles.  3:31 AM Discussed patient's case with hospitalist, Dr. Marlowe Sax.  I have recommended admission and patient (and family if present) agree with this plan. Admitting physician will place admission orders.   I reviewed all nursing notes, vitals, pertinent previous records, EKGs, lab and urine results, imaging (as available).     Ward, Delice Bison, DO 12/08/18 [phone]

## 2018-12-08 ENCOUNTER — Emergency Department (HOSPITAL_COMMUNITY): Payer: Medicare HMO

## 2018-12-08 DIAGNOSIS — I639 Cerebral infarction, unspecified: Secondary | ICD-10-CM | POA: Diagnosis present

## 2018-12-08 DIAGNOSIS — I6381 Other cerebral infarction due to occlusion or stenosis of small artery: Secondary | ICD-10-CM | POA: Diagnosis not present

## 2018-12-08 DIAGNOSIS — Z66 Do not resuscitate: Secondary | ICD-10-CM | POA: Diagnosis not present

## 2018-12-08 DIAGNOSIS — D320 Benign neoplasm of cerebral meninges: Secondary | ICD-10-CM | POA: Diagnosis not present

## 2018-12-08 DIAGNOSIS — R29701 NIHSS score 1: Secondary | ICD-10-CM | POA: Diagnosis present

## 2018-12-08 DIAGNOSIS — D352 Benign neoplasm of pituitary gland: Secondary | ICD-10-CM

## 2018-12-08 DIAGNOSIS — E785 Hyperlipidemia, unspecified: Secondary | ICD-10-CM | POA: Diagnosis not present

## 2018-12-08 DIAGNOSIS — I10 Essential (primary) hypertension: Secondary | ICD-10-CM | POA: Diagnosis not present

## 2018-12-08 DIAGNOSIS — D329 Benign neoplasm of meninges, unspecified: Secondary | ICD-10-CM | POA: Diagnosis not present

## 2018-12-08 DIAGNOSIS — I340 Nonrheumatic mitral (valve) insufficiency: Secondary | ICD-10-CM | POA: Diagnosis not present

## 2018-12-08 DIAGNOSIS — Z79899 Other long term (current) drug therapy: Secondary | ICD-10-CM | POA: Diagnosis not present

## 2018-12-08 DIAGNOSIS — Z87891 Personal history of nicotine dependence: Secondary | ICD-10-CM | POA: Diagnosis not present

## 2018-12-08 DIAGNOSIS — E871 Hypo-osmolality and hyponatremia: Secondary | ICD-10-CM | POA: Diagnosis not present

## 2018-12-08 DIAGNOSIS — R9431 Abnormal electrocardiogram [ECG] [EKG]: Secondary | ICD-10-CM

## 2018-12-08 DIAGNOSIS — I6522 Occlusion and stenosis of left carotid artery: Secondary | ICD-10-CM | POA: Diagnosis not present

## 2018-12-08 DIAGNOSIS — Z1159 Encounter for screening for other viral diseases: Secondary | ICD-10-CM | POA: Diagnosis not present

## 2018-12-08 LAB — BASIC METABOLIC PANEL
Anion gap: 8 (ref 5–15)
BUN: 10 mg/dL (ref 8–23)
CO2: 27 mmol/L (ref 22–32)
Calcium: 8.9 mg/dL (ref 8.9–10.3)
Chloride: 95 mmol/L — ABNORMAL LOW (ref 98–111)
Creatinine, Ser: 0.87 mg/dL (ref 0.61–1.24)
GFR calc Af Amer: >60 mL/min (ref >60)
GFR calc non Af Amer: >60 mL/min (ref >60)
Glucose, Bld: 88 mg/dL (ref 70–99)
Potassium: 3.9 mmol/L (ref 3.5–5.1)
Sodium: 130 mmol/L — ABNORMAL LOW (ref 135–145)

## 2018-12-08 LAB — HEMOGLOBIN A1C
Hgb A1c MFr Bld: 5.4 % (ref 4.8–5.6)
Mean Plasma Glucose: 108.28 mg/dL

## 2018-12-08 LAB — LIPID PANEL
Cholesterol: 162 mg/dL (ref 0–200)
HDL: 74 mg/dL (ref >40)
LDL Cholesterol: 78 mg/dL (ref 0–99)
Total CHOL/HDL Ratio: 2.2 RATIO
Triglycerides: 52 mg/dL (ref <150)
VLDL: 10 mg/dL (ref 0–40)

## 2018-12-08 LAB — URINALYSIS, ROUTINE W REFLEX MICROSCOPIC
Bilirubin Urine: NEGATIVE
Glucose, UA: NEGATIVE mg/dL
Hgb urine dipstick: NEGATIVE
Ketones, ur: NEGATIVE mg/dL
Leukocytes,Ua: NEGATIVE
Nitrite: NEGATIVE
Protein, ur: NEGATIVE mg/dL
Specific Gravity, Urine: 1.011 (ref 1.005–1.030)
pH: 8 (ref 5.0–8.0)

## 2018-12-08 LAB — OSMOLALITY: Osmolality: 270 mOsm/kg — ABNORMAL LOW (ref 275–295)

## 2018-12-08 MED ORDER — ADULT MULTIVITAMIN W/MINERALS CH
1.0000 | ORAL_TABLET | Freq: Every day | ORAL | Status: DC
  Administered 2018-12-08 – 2018-12-09 (×2): 1 via ORAL
  Filled 2018-12-08 (×2): qty 1

## 2018-12-08 MED ORDER — ASPIRIN EC 81 MG PO TBEC
81.0000 mg | DELAYED_RELEASE_TABLET | Freq: Every day | ORAL | Status: DC
  Administered 2018-12-08 – 2018-12-09 (×2): 81 mg via ORAL
  Filled 2018-12-08 (×2): qty 1

## 2018-12-08 MED ORDER — ENOXAPARIN SODIUM 40 MG/0.4ML ~~LOC~~ SOLN
40.0000 mg | SUBCUTANEOUS | Status: DC
  Administered 2018-12-09: 40 mg via SUBCUTANEOUS
  Filled 2018-12-08 (×2): qty 0.4

## 2018-12-08 MED ORDER — ACETAMINOPHEN 160 MG/5ML PO SOLN: 650.0000 mg | ORAL | Status: DC | PRN

## 2018-12-08 MED ORDER — ATORVASTATIN CALCIUM 80 MG PO TABS: 80.0000 mg | ORAL_TABLET | Freq: Every day | ORAL | Status: DC

## 2018-12-08 MED ORDER — FOLIC ACID 1 MG PO TABS
1.0000 mg | ORAL_TABLET | Freq: Every day | ORAL | Status: DC
  Administered 2018-12-08 – 2018-12-09 (×2): 1 mg via ORAL
  Filled 2018-12-08 (×2): qty 1

## 2018-12-08 MED ORDER — ACETAMINOPHEN 325 MG PO TABS
650.0000 mg | ORAL_TABLET | ORAL | Status: DC | PRN
  Administered 2018-12-08 – 2018-12-09 (×2): 650 mg via ORAL
  Filled 2018-12-08 (×2): qty 2

## 2018-12-08 MED ORDER — DOCUSATE SODIUM 100 MG PO CAPS
100.0000 mg | ORAL_CAPSULE | Freq: Every day | ORAL | Status: DC | PRN
  Administered 2018-12-08: 100 mg via ORAL
  Filled 2018-12-08: qty 1

## 2018-12-08 MED ORDER — SODIUM CHLORIDE 0.9 % IV SOLN
INTRAVENOUS | Status: AC
  Administered 2018-12-08: 06:00:00 via INTRAVENOUS

## 2018-12-08 MED ORDER — CLOPIDOGREL BISULFATE 75 MG PO TABS
75.0000 mg | ORAL_TABLET | Freq: Every day | ORAL | Status: DC
  Administered 2018-12-08 – 2018-12-09 (×2): 75 mg via ORAL
  Filled 2018-12-08 (×2): qty 1

## 2018-12-08 MED ORDER — ACETAMINOPHEN 650 MG RE SUPP: 650.0000 mg | RECTAL | Status: DC | PRN

## 2018-12-08 MED ORDER — ACETAMINOPHEN 500 MG PO TABS
1000.0000 mg | ORAL_TABLET | Freq: Once | ORAL | Status: AC
  Administered 2018-12-08: 1000 mg via ORAL
  Filled 2018-12-08: qty 2

## 2018-12-08 MED ORDER — LORAZEPAM 2 MG/ML IJ SOLN: 1.0000 mg | Freq: Four times a day (QID) | INTRAMUSCULAR | Status: DC | PRN

## 2018-12-08 MED ORDER — PRAVASTATIN SODIUM 10 MG PO TABS
20.0000 mg | ORAL_TABLET | Freq: Every day | ORAL | Status: DC
  Administered 2018-12-08 – 2018-12-09 (×2): 20 mg via ORAL
  Filled 2018-12-08 (×2): qty 2

## 2018-12-08 MED ORDER — THIAMINE HCL 100 MG/ML IJ SOLN: 100.0000 mg | Freq: Every day | INTRAMUSCULAR | Status: DC

## 2018-12-08 MED ORDER — LORAZEPAM 1 MG PO TABS: 1.0000 mg | ORAL_TABLET | Freq: Four times a day (QID) | ORAL | Status: DC | PRN

## 2018-12-08 MED ORDER — ASPIRIN 81 MG PO CHEW
324.0000 mg | CHEWABLE_TABLET | Freq: Once | ORAL | Status: AC
  Administered 2018-12-08: 324 mg via ORAL
  Filled 2018-12-08: qty 4

## 2018-12-08 MED ORDER — ASPIRIN 325 MG PO TABS: 325.0000 mg | ORAL_TABLET | Freq: Every day | ORAL | Status: DC

## 2018-12-08 MED ORDER — STROKE: EARLY STAGES OF RECOVERY BOOK
Freq: Once | Status: DC
  Filled 2018-12-08: qty 1

## 2018-12-08 MED ORDER — VITAMIN B-1 100 MG PO TABS
100.0000 mg | ORAL_TABLET | Freq: Every day | ORAL | Status: DC
  Administered 2018-12-08 – 2018-12-09 (×2): 100 mg via ORAL
  Filled 2018-12-08 (×2): qty 1

## 2018-12-08 NOTE — Evaluation (Signed)
Occupational Therapy Evaluation Patient Details Name: Clayton Odonnell MRN: [id number] DOB: [birth year] Today's Date: 12/08/2018    History of Present Illness Pt is an 85 y/o male with a PMH significant for HTN, L femur IM nail 2015. He presents to the ED via EMS with vision changes and L-side sensory changes in face, hand, and lower leg/foot. 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   Clinical Impression   Pt PTA: living alone with supportive family members nearby. Pt reports that he does not drive and family provided all grocery shopping for pt so that he did not have to leave home. Pt currently A/O x4. Pt performing ADL functional mobility and transfers with supervisionA. Pt with LUE with deficits in fine motor coordination and light touch sensitivity. Pt performing ADL at baseline with BUEs and pt able to discriminate Hot versus Cold with LUE. Pt advised to perform fine motor coordination tasks. Pt would benefit from continued OT if symptoms persist in LUE in [facility] setting or OP OT. OT following acutely.      Follow Up Recommendations  Home health OT;Outpatient OT;Supervision - Intermittent    Equipment Recommendations  None recommended by OT    Recommendations for Other Services       Precautions / Restrictions Precautions Precautions: Fall Restrictions Weight Bearing Restrictions: No      Mobility Bed Mobility Overal bed mobility: Modified Independent Bed Mobility: Supine to Sit     Supine to sit: Supervision     General bed mobility comments: No assist required.  Transfers Overall transfer level: Modified independent Equipment used: None             General transfer comment: pt powered-up to full stand without assistance and without LOB    Balance Overall balance assessment: Mild deficits observed, not  formally tested                                         ADL either performed or assessed with clinical judgement   ADL Overall ADL's : At baseline                                       General ADL Comments: Pt is R hand dominant and mostly independent with ADL, LUE continues to be able to stabilize phone, assist with sock donning and tell difference in temperature.     Vision Baseline Vision/History: Wears glasses Patient Visual Report: No change from baseline Vision Assessment?: Yes Eye Alignment: Within Functional Limits Ocular Range of Motion: Within Functional Limits Alignment/Gaze Preference: Within Defined Limits Tracking/Visual Pursuits: Able to track stimulus in all quads without difficulty Saccades: Within functional limits Convergence: Within functional limits Visual Fields: No apparent deficits     Perception     Praxis      Pertinent Vitals/Pain Pain Assessment: (P) Faces Pain Score: 3  Pain Descriptors / Indicators: Headache;Nagging Pain Intervention(s): Monitored during session     Hand Dominance Right   Extremity/Trunk Assessment Upper Extremity Assessment Upper Extremity Assessment: LUE deficits/detail LUE Deficits / Details: Strength 5/5 in shoulder flexion, biceps, triceps. Noted decreased coordination in fine motor in hand, and finger-to-nose.  LUE Sensation: decreased light  touch(at fingertip) LUE Coordination: decreased fine motor   Lower Extremity Assessment Lower Extremity Assessment: Defer to PT evaluation LLE Deficits / Details: Decreased ankle DF 4/5 but otherwise 5/5 strength in hip flexors, quads, hamstrings. Pt reports decreased sensation in medial lower leg and foot LLE Sensation: decreased light touch   Cervical / Trunk Assessment Cervical / Trunk Assessment: Normal(Forward head posture with rounded shoulders)   Communication Communication Communication: No difficulties   Cognition  Arousal/Alertness: Awake/alert Behavior During Therapy: WFL for tasks assessed/performed Overall Cognitive Status: Within Functional Limits for tasks assessed                                     General Comments  Pt with light touch discrimination deficits, fine motor coordination- intact and fair.    Exercises     Shoulder Instructions      Home Living Family/patient expects to be discharged to:: Private residence Living Arrangements: Alone Available Help at Discharge: (P) Family;Available 24 hours/day Type of Home: Mobile home Home Access: Ramped entrance     Home Layout: One level     Bathroom Shower/Tub: Occupational psychologist: Standard     Home Equipment: Shower seat;Cane - single point;Walker - 2 wheels;Wheelchair - manual      Lives With: (P) Alone    Prior Functioning/Environment Level of Independence: Independent        Comments: Drives, daughters do grocery shopping 2 pandemic.         OT Problem List: Decreased strength;Decreased safety awareness;Impaired balance (sitting and/or standing);Impaired UE functional use      OT Treatment/Interventions: Self-care/ADL training;Therapeutic exercise;Therapeutic activities;Patient/family education;Balance training    OT Goals(Current goals can be found in the care plan section) Acute Rehab OT Goals Patient Stated Goal: to go home OT Goal Formulation: With patient Time For Goal Achievement: 12/22/18 Potential to Achieve Goals: Good ADL Goals Additional ADL Goal #1: Pt will tolerate LUE HEP with independence with handout provided to increase to good fine motor coordination.  OT Frequency: Min 2X/week   Barriers to D/C:            Co-evaluation              AM-PAC OT "6 Clicks" Daily Activity     Outcome Measure Help from another person eating meals?: None Help from another person taking care of personal grooming?: None Help from another person toileting, which includes  using toliet, bedpan, or urinal?: None Help from another person bathing (including washing, rinsing, drying)?: None Help from another person to put on and taking off regular upper body clothing?: None Help from another person to put on and taking off regular lower body clothing?: None 6 Click Score: 24   End of Session Equipment Utilized During Treatment: Gait belt Nurse Communication: Mobility status  Activity Tolerance: Patient tolerated treatment well Patient left: in chair;with call bell/phone within reach  OT Visit Diagnosis: Unsteadiness on feet (R26.81);Muscle weakness (generalized) (M62.81)                Time: 5176-1607 OT Time Calculation (min): 20 min Charges:  OT General Charges $OT Visit: 1 Visit OT Evaluation $OT Eval Moderate Complexity: 1 Mod  Darryl Nestle) Marsa Aris OTR/L Acute Rehabilitation Services Pager: [phone] Office: [phone]   Audie Pinto 12/08/2018, 1:07 PM

## 2018-12-08 NOTE — Evaluation (Signed)
Speech Language Pathology Evaluation Patient Details Name: Clayton Odonnell MRN: [id number] DOB: 06-01-1931 Today's Date: 12/08/2018 Time: 6283-6629 SLP Time Calculation (min) (ACUTE ONLY): 26 min  Problem List:  Patient Active Problem List   Diagnosis Date Noted  . Acute CVA (cerebrovascular accident) ([place]) 12/08/2018  . Meningioma ([place]) 12/08/2018  . Pituitary adenoma ([place]) 12/08/2018  . Hyponatremia 12/08/2018  . QT prolongation 12/08/2018  . Chest pain 06/27/2015  . Hyperlipidemia 06/27/2015  . Upper back pain on right side 06/27/2015  . Stress at home 06/27/2015  . Pain in the chest   . Chronic constipation 03/17/2014  . Essential hypertension, benign 03/14/2014  . Patient is Jehovah's Witness 03/14/2014  . Other and unspecified hyperlipidemia 03/14/2014  . Closed left hip fracture ([place]) 03/13/2014   Past Medical History:  Past Medical History:  Diagnosis Date  . Hyperlipemia   . Hypertension   . Pneumonia   . Transfusion of blood product refused for religious reason    Past Surgical History:  Past Surgical History:  Procedure Laterality Date  . FEMUR IM NAIL Left 03/14/2014   Procedure: INTRAMEDULLARY (IM) NAIL FEMORAL;  Surgeon: Renette Butters, MD;  Location: [place];  Service: Orthopedics;  Laterality: Left;  . TONSILLECTOMY     HPI:  Pt is an 85 y/o male with a PMH significant for HTN, L femur IM nail 2015. He presents to the ED via EMS with vision changes and L-side sensory changes in face, hand, and lower leg/foot. 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   Assessment / Plan / Recommendation Clinical Impression  Pt scored a 26/30 on MOCA indicating abilities that are within normal limits. He demonstrated difficulty in visuospatial subtest, surprisingly writing correct time on clock to dictation (unable  with max cues) and word retrieval during working memory. Speech in conversation is intelligible, no motor speech concerns and he expresses himself without difficulty. Initially he denied needing assist for memory prior to hospitalization then recalled he uses his smartphone to make notes for later recall (grocery lists). He states with Covid restrictions, his daughter is retrieving groceries. He states he does not like to use pill boxes and realizes he may after this hospitalization which therapist encouraged. Do not recommend ST in acute care however he may benefit from daughter supervising initial bill paying session and medication management.          SLP Assessment  SLP Recommendation/Assessment: Patient does not need any further Speech Lanaguage Pathology Services SLP Visit Diagnosis: Cognitive communication deficit (R41.841)    Follow Up Recommendations  None    Frequency and Duration           SLP Evaluation Cognition  Overall Cognitive Status: Within Functional Limits for tasks assessed Arousal/Alertness: Awake/alert Orientation Level: Oriented X4 Attention: Sustained Sustained Attention: Appears intact Memory: Impaired Memory Impairment: Retrieval deficit Awareness: Appears intact Problem Solving: Appears intact Safety/Judgment: Appears intact       Comprehension  Auditory Comprehension Overall Auditory Comprehension: Appears within functional limits for tasks assessed Visual Recognition/Discrimination Discrimination: Not tested Reading Comprehension Reading Status: Within funtional limits    Expression Expression Primary Mode of Expression: Verbal Verbal Expression Overall Verbal Expression: Appears within functional limits for tasks assessed Level of Generative/Spontaneous Verbalization: Conversation Repetition: No impairment Naming: Not tested Pragmatics: No impairment Written Expression Dominant Hand: Right Written Expression: Not tested  Oral / Motor   Oral Motor/Sensory Function Overall Oral Motor/Sensory Function: Within functional limits Motor Speech Overall Motor Speech: Appears within functional limits for tasks assessed Intelligibility: Intelligible Motor Planning: Witnin functional limits   GO                    Houston Siren 12/08/2018, 2:02 PM  Orbie Pyo Colvin Caroli.Ed Risk analyst [phone] Office [phone]

## 2018-12-08 NOTE — Plan of Care (Signed)
85 year old male with a history of hypertension hyperlipidemia admitted this morning with vision changes and left-sided numbness and is found to have 6 mm early acute to subacute right lateral lacunar infarction within the right thalamus with diffuse atrophy.  Left anterior frontal meningioma 13 mm with mild mass-effect no edema, enlarged pituitary with partial effacement of suprasellar cistern suspected pituitary adenoma.  CT angiogram of the head and neck shows fibrofatty plaque in the left carotid bifurcation with 60% moderate proximal ICA stenosis. his transthoracic echo and carotid ultrasound is pending at this time.    His hemoglobin A1c is 5.4 total cholesterol 162 LDL 78.  Continue aspirin and Lipitor.  Stroke work-up is still ongoing.  PT OT and speech has seen the patient.  Neurology has been consulted and appreciate the recommendations.  Patient needs ongoing inpatient care due to acute stroke and further work-up and recommendations from neurology depending on the work-up.

## 2018-12-08 NOTE — Progress Notes (Signed)
Patient arrived to 3W07, A&Ox4, VSS, LAC IV intact.  Patient oriented to room and equipment.  Will continue to monitor.

## 2018-12-08 NOTE — H&P (Signed)
History and Physical    Clayton Odonnell IWP:[id number] DOB: Apr 03, 1931 DOA: 12/07/2018  PCP: Leanna Battles, MD Patient coming from: Home  Chief Complaint: Vision changes, left-sided numbness  HPI: Clayton Odonnell is a 85 y.o. male with medical history significant of hypertension, hyperlipidemia presenting to the hospital via EMS for evaluation of vision changes and left-sided numbness.  Patient states around 7:30 PM yesterday evening he experienced acute onset blurry vision in his left eye and the left side of his face and left hand became numb.  Denies any weakness in his upper or lower extremities.  Denies any difficulty with speech.  Reports having a headache.  Denies history of prior stroke.  States he smoked cigarettes during his teenage years and is not a current smoker.  States the vision in his left eye has now reapproved and is back to baseline.  Continues to experience slight numbness on the left side of his face and his left hand.  Denies any fevers, chills, chest pain, cough, shortness of breath, nausea, vomiting, abdominal pain, diarrhea, or dysuria.  No other complaints.  ED Course: Vitals stable on arrival.  No leukocytosis.  Hemoglobin 11.8, baseline in the 12 range.  Sodium 130.  Blood glucose 103.  UA not suggestive of infection.  COVID-19 rapid test pending.  CT head showing stable meningioma, stable fullness of the pituitary fossa thought to represent underlying pituitary adenoma, and no acute intracranial abnormality.  CT angiogram head negative for large vessel occlusion.  CT angiogram neck showing 60% moderate proximal ICA stenosis.  Brain MRI showing a 6 mm acute/early subacute infarction within the right lateral thalamus.  No hemorrhage or mass-effect.  Left anterior frontal meningioma measuring up to 30 mm with mild mass-effect on the underlying left frontal lobe.  No edema in the left frontal lobe.  Enlarged pituitary with partial effacement of suprasellar cistern,  suspected underlying pituitary adenoma. Received aspirin 324 mg in the ED. Neurology consulted.  Review of Systems:  All systems reviewed and apart from history of presenting illness, are negative.  Past Medical History:  Diagnosis Date   Hyperlipemia    Hypertension    Pneumonia    Transfusion of blood product refused for religious reason     Past Surgical History:  Procedure Laterality Date   FEMUR IM NAIL Left 03/14/2014   Procedure: INTRAMEDULLARY (IM) NAIL FEMORAL;  Surgeon: Renette Butters, MD;  Location: [facility] [facility];  Service: Orthopedics;  Laterality: Left;   TONSILLECTOMY       reports that he quit smoking about 65 years ago. His smoking use included cigarettes. He has never used smokeless tobacco. He reports current alcohol use of about 14.0 standard drinks of alcohol per week. He reports that he does not use drugs.  Allergies  Allergen Reactions   Tetanus Toxoids    Penicillins Hives and Rash    Did it involve swelling of the face/tongue/throat, SOB, or low BP? No Did it involve sudden or severe rash/hives, skin peeling, or any reaction on the inside of your mouth or nose? Yes Did you need to seek medical attention at a hospital or doctor's office? No When did it last happen?Unknown If all above answers are NO, may proceed with cephalosporin use.     Family History  Problem Relation Age of Onset   CAD Brother    Osteoporosis Daughter     Prior to Admission medications   Medication Sig Start Date End Date Taking? Authorizing Provider  acetaminophen (TYLENOL) 500  MG tablet Take 1,000 mg by mouth every 8 (eight) hours as needed (pain).   Yes [provider]  losartan (COZAAR) 50 MG tablet Take 50 mg by mouth daily.   Yes [provider]  lovastatin (MEVACOR) 10 MG tablet Take 10 mg by mouth at bedtime.   Yes [provider]  meloxicam (MOBIC) 15 MG tablet Take 15 mg by mouth daily as needed for pain.   Yes [provider]  Multiple Vitamin (MULTIVITAMIN WITH MINERALS) TABS tablet Take 1 tablet by mouth daily.   Yes [provider]    Physical Exam: Vitals:   12/08/18 0015 12/08/18 0030 12/08/18 0045 12/08/18 0301  BP: 140/69 (!) 147/62 (!) 142/74 (!) 178/76  Pulse: (!) 53 (!) 53 (!) 51 60  Resp: 12 13 12 15   Temp:    98.6 F (37 C)  TempSrc:    Oral  SpO2: 98% 96% 97% 95%  Weight:      Height:        Physical Exam  Constitutional: He is oriented to person, place, and time. He appears well-developed and well-nourished. No distress.  HENT:  Head: Normocephalic.  Mouth/Throat: Oropharynx is clear and moist.  Eyes: Pupils are equal, round, and reactive to light. Right eye exhibits no discharge. Left eye exhibits no discharge.  Neck: Neck supple.  Cardiovascular: Normal rate, regular rhythm and intact distal pulses.  Pulmonary/Chest: Effort normal and breath sounds normal. No respiratory distress. He has no wheezes. He has no rales.  Abdominal: Soft. Bowel sounds are normal. He exhibits no distension. There is no abdominal tenderness. There is no guarding.  Musculoskeletal:        General: No edema.  Neurological: He is alert and oriented to person, place, and time. A cranial nerve deficit is present.  Speech fluent, tongue midline, no facial droop Sensation diminished on the left side of the face (forehead spared) Sensation to light touch intact in bilateral upper and lower extremities. Strength 5 out of 5 in bilateral upper and lower extremities.  Skin: Skin is warm and dry. He is not diaphoretic.     Labs on Admission: I have personally reviewed following labs and imaging studies  CBC: Recent Labs  Lab 12/07/18 2216  WBC 6.5  NEUTROABS 3.7  HGB 11.8*  HCT 36.0*  MCV 91.1  PLT 938   Basic Metabolic Panel: Recent Labs  Lab 12/07/18 2216  NA 130*  K 4.7  CL 97*  CO2 26  GLUCOSE 103*  BUN 16  CREATININE 0.96  CALCIUM 9.4   GFR: Estimated Creatinine  Clearance: 55.2 mL/min (by C-G formula based on SCr of 0.96 mg/dL). Liver Function Tests: Recent Labs  Lab 12/07/18 2216  AST 30  ALT 18  ALKPHOS 35*  BILITOT 0.9  PROT 6.5  ALBUMIN 4.0   No results for input(s): LIPASE, AMYLASE in the last 168 hours. No results for input(s): AMMONIA in the last 168 hours. Coagulation Profile: No results for input(s): INR, PROTIME in the last 168 hours. Cardiac Enzymes: No results for input(s): CKTOTAL, CKMB, CKMBINDEX, TROPONINI in the last 168 hours. BNP (last 3 results) No results for input(s): PROBNP in the last 8760 hours. HbA1C: No results for input(s): HGBA1C in the last 72 hours. CBG: No results for input(s): GLUCAP in the last 168 hours. Lipid Profile: No results for input(s): CHOL, HDL, LDLCALC, TRIG, CHOLHDL, LDLDIRECT in the last 72 hours. Thyroid Function Tests: No results for input(s): TSH, T4TOTAL, FREET4, T3FREE, THYROIDAB  in the last 72 hours. Anemia Panel: No results for input(s): VITAMINB12, FOLATE, FERRITIN, TIBC, IRON, RETICCTPCT in the last 72 hours. Urine analysis:    Component Value Date/Time   COLORURINE STRAW (A) 12/07/2018 2331   APPEARANCEUR CLEAR 12/07/2018 2331   LABSPEC 1.011 12/07/2018 2331   PHURINE 8.0 12/07/2018 2331   GLUCOSEU NEGATIVE 12/07/2018 2331   HGBUR NEGATIVE 12/07/2018 2331   BILIRUBINUR NEGATIVE 12/07/2018 2331   KETONESUR NEGATIVE 12/07/2018 2331   PROTEINUR NEGATIVE 12/07/2018 2331   UROBILINOGEN 0.2 03/14/2014 0459   NITRITE NEGATIVE 12/07/2018 2331   LEUKOCYTESUR NEGATIVE 12/07/2018 2331    Radiological Exams on Admission: Ct Angio Head W Or Wo Contrast  Result Date: 12/08/2018 CLINICAL DATA:  85 y/o M; transient left hand and face numbness with blurry vision. EXAM: CT ANGIOGRAPHY HEAD AND NECK TECHNIQUE: Multidetector CT imaging of the head and neck was performed using the standard protocol during bolus administration of intravenous contrast. Multiplanar CT image reconstructions and  MIPs were obtained to evaluate the vascular anatomy. Carotid stenosis measurements (when applicable) are obtained utilizing NASCET criteria, using the distal internal carotid diameter as the denominator. CONTRAST:  60mL OMNIPAQUE IOHEXOL 350 MG/ML SOLN COMPARISON:  03/13/2014 CT head. FINDINGS: CT HEAD FINDINGS Brain: No evidence of acute infarction, hemorrhage, hydrocephalus, extra-axial collection or herniation. Stable chronic microvascular ischemic changes and volume loss of the brain. Stable left anterolateral frontal region hyperdense extra-axial mass measuring 13 x 30 mm (AP by ML series 4, image 21) compatible with meningioma. Mild associated mass effect on the left frontal lobe with sulcal crowding. No adjacent brain edema. Stable enlargement of the pituitary gland and infundibulum with partial effacement of the suprasellar cistern. Vascular: As below. Skull: Normal. Negative for fracture or focal lesion. Sinuses: Imaged portions are clear. Orbits: No acute finding. Review of the MIP images confirms the above findings CTA NECK FINDINGS Aortic arch: Standard branching. Imaged portion shows no evidence of aneurysm or dissection. No significant stenosis of the major arch vessel origins. Aortic calcific atherosclerosis. Right carotid system: No evidence of dissection, stenosis (50% or greater) or occlusion. Left carotid system: Fibrofatty plaque of left carotid bifurcation with 60% moderate proximal ICA stenosis. Vertebral arteries: Right dominant. No evidence of dissection, stenosis (50% or greater) or occlusion. Skeleton: No acute osseous abnormality. Mild-to-moderate cervical spondylosis with multilevel disc and facet degenerative changes. No high-grade bony spinal canal stenosis. Other neck: Negative. Upper chest: Negative. Review of the MIP images confirms the above findings CTA HEAD FINDINGS Anterior circulation: Non stenotic calcific atherosclerosis of the carotid siphons. No significant stenosis,  proximal occlusion, aneurysm, or vascular malformation. Posterior circulation: Mild lower basilar stenosis. Otherwise no significant stenosis, proximal occlusion, aneurysm, or vascular malformation. Venous sinuses: As permitted by contrast timing, patent. Anatomic variants: Left dominant vertebrobasilar system. Left vertebral artery terminates in left PICA. Fetal left PCA. Large left A1 and anterior communicating artery with hypoplastic right A1, normal variant. Review of the MIP images confirms the above findings IMPRESSION: CT head: 1. No acute intracranial abnormality identified. 2. Stable left anterolateral frontal region hyperdense extra-axial mass compatible with meningioma. 3. Stable fullness of the pituitary fossa with partial effacement of suprasellar cistern, possibly representing underlying pituitary adenoma. This can be further characterized with pituitary MRI without and with contrast on a nonemergent basis. 4. Stable chronic microvascular ischemic changes and parenchymal volume loss of the brain. CTA neck: 1. Fibrofatty plaque of left carotid bifurcation with 60% moderate proximal ICA stenosis. 2. Patent right carotid system and bilateral  vertebral arteries. No dissection, aneurysm, or hemodynamically significant stenosis utilizing NASCET criteria. CTA head: Patent anterior and posterior intracranial circulation. No large vessel occlusion, aneurysm, or significant stenosis is identified. Electronically Signed   By: Kristine Garbe M.D.   On: 12/08/2018 00:12   Ct Angio Neck W And/or Wo Contrast  Result Date: 12/08/2018 CLINICAL DATA:  85 y/o M; transient left hand and face numbness with blurry vision. EXAM: CT ANGIOGRAPHY HEAD AND NECK TECHNIQUE: Multidetector CT imaging of the head and neck was performed using the standard protocol during bolus administration of intravenous contrast. Multiplanar CT image reconstructions and MIPs were obtained to evaluate the vascular anatomy. Carotid  stenosis measurements (when applicable) are obtained utilizing NASCET criteria, using the distal internal carotid diameter as the denominator. CONTRAST:  28mL OMNIPAQUE IOHEXOL 350 MG/ML SOLN COMPARISON:  03/13/2014 CT head. FINDINGS: CT HEAD FINDINGS Brain: No evidence of acute infarction, hemorrhage, hydrocephalus, extra-axial collection or herniation. Stable chronic microvascular ischemic changes and volume loss of the brain. Stable left anterolateral frontal region hyperdense extra-axial mass measuring 13 x 30 mm (AP by ML series 4, image 21) compatible with meningioma. Mild associated mass effect on the left frontal lobe with sulcal crowding. No adjacent brain edema. Stable enlargement of the pituitary gland and infundibulum with partial effacement of the suprasellar cistern. Vascular: As below. Skull: Normal. Negative for fracture or focal lesion. Sinuses: Imaged portions are clear. Orbits: No acute finding. Review of the MIP images confirms the above findings CTA NECK FINDINGS Aortic arch: Standard branching. Imaged portion shows no evidence of aneurysm or dissection. No significant stenosis of the major arch vessel origins. Aortic calcific atherosclerosis. Right carotid system: No evidence of dissection, stenosis (50% or greater) or occlusion. Left carotid system: Fibrofatty plaque of left carotid bifurcation with 60% moderate proximal ICA stenosis. Vertebral arteries: Right dominant. No evidence of dissection, stenosis (50% or greater) or occlusion. Skeleton: No acute osseous abnormality. Mild-to-moderate cervical spondylosis with multilevel disc and facet degenerative changes. No high-grade bony spinal canal stenosis. Other neck: Negative. Upper chest: Negative. Review of the MIP images confirms the above findings CTA HEAD FINDINGS Anterior circulation: Non stenotic calcific atherosclerosis of the carotid siphons. No significant stenosis, proximal occlusion, aneurysm, or vascular malformation. Posterior  circulation: Mild lower basilar stenosis. Otherwise no significant stenosis, proximal occlusion, aneurysm, or vascular malformation. Venous sinuses: As permitted by contrast timing, patent. Anatomic variants: Left dominant vertebrobasilar system. Left vertebral artery terminates in left PICA. Fetal left PCA. Large left A1 and anterior communicating artery with hypoplastic right A1, normal variant. Review of the MIP images confirms the above findings IMPRESSION: CT head: 1. No acute intracranial abnormality identified. 2. Stable left anterolateral frontal region hyperdense extra-axial mass compatible with meningioma. 3. Stable fullness of the pituitary fossa with partial effacement of suprasellar cistern, possibly representing underlying pituitary adenoma. This can be further characterized with pituitary MRI without and with contrast on a nonemergent basis. 4. Stable chronic microvascular ischemic changes and parenchymal volume loss of the brain. CTA neck: 1. Fibrofatty plaque of left carotid bifurcation with 60% moderate proximal ICA stenosis. 2. Patent right carotid system and bilateral vertebral arteries. No dissection, aneurysm, or hemodynamically significant stenosis utilizing NASCET criteria. CTA head: Patent anterior and posterior intracranial circulation. No large vessel occlusion, aneurysm, or significant stenosis is identified. Electronically Signed   By: Kristine Garbe M.D.   On: 12/08/2018 00:12   Mr Brain Wo Contrast  Result Date: 12/08/2018 CLINICAL DATA:  85 y/o M; headache, vision changes, left-sided  numbness. EXAM: MRI HEAD WITHOUT CONTRAST TECHNIQUE: Multiplanar, multiecho pulse sequences of the brain and surrounding structures were obtained without intravenous contrast. COMPARISON:  12/07/2018 CT head. FINDINGS: Brain: 6 mm focus of reduced diffusion within the right lateral thalamus (series 9, image 71) compatible with acute/early subacute infarction. No associated hemorrhage or mass  effect. No extra-axial collection, hydrocephalus, intracranial hemorrhage, or herniation. Early confluent nonspecific T2 FLAIR hyperintensities in subcortical and periventricular white matter are compatible with moderate chronic microvascular ischemic changes. Moderate volume loss of the brain. The pituitary gland is enlarged measuring up to 13 mm craniocaudal. Partial effacement of suprasellar cistern. 30 x 13 mm meningioma in the left anterior frontal region (series 14, image 14). Very mild mass effect on the underlying left frontal lobe. No left frontal lobe edema. Vascular: Normal flow voids. Skull and upper cervical spine: Normal marrow signal. Sinuses/Orbits: Partial opacification of right mastoid air cells. No abnormal signal of paranasal sinuses or left mastoid air cells. Other: None. IMPRESSION: 1. 6 mm acute/early subacute infarction within right lateral thalamus. No hemorrhage or mass effect. 2. Left anterior frontal meningioma measuring up to 30 mm with mild mass effect on the underlying left frontal lobe. No edema in the left frontal lobe. 3. Moderate chronic microvascular ischemic changes and volume loss of the brain. 4. Enlarged pituitary with partial effacement of suprasellar cistern, suspected underlying pituitary adenoma. This can be further characterized with pituitary protocol MRI with and without contrast. These results were called by telephone at the time of interpretation on 12/08/2018 at 2:40 am to Dr. Leonides Schanz, who verbally acknowledged these results. Electronically Signed   By: Kristine Garbe M.D.   On: 12/08/2018 02:42    EKG: Independently reviewed.  Sinus rhythm (heart rate 61), QTc 528, baseline wander.  Assessment/Plan Principal Problem:   Acute CVA (cerebrovascular accident) [facility]) Active Problems:   Meningioma ([place])   Pituitary adenoma ([facility])   Hyponatremia   QT prolongation   Acute CVA Presenting with complaints of left eye blurry vision and numbness of the left  side of the face and left hand.  Brain MRI showing a 6 mm acute/early subacute infarction within the right lateral thalamus.  No hemorrhage or mass-effect.  CT angiogram neck showing 60% moderate proximal ICA stenosis.  CT angiogram head negative for large vessel occlusion. -Neurology consulted by ED provider, appreciate recommendations -Telemetry monitoring -Allow for permissive hypertension-treat only if systolic blood pressures are greater than 220. -2D echocardiogram -Hemoglobin A1c, fasting lipid panel -Aspirin 325 mg now and daily -Atorvastatin 80 mg daily -Frequent neurochecks -PT, OT, speech therapy. -N.p.o. until cleared by bedside swallow evaluation or formal speech evaluation  Meningioma MRI showing left anterior frontal meningioma measuring up to 30 mm with mild mass-effect on the underlying left frontal lobe.  No edema in the left frontal lobe.  His current neurologic deficits can be explained by the acute/subacute right lateral thalamic infarct. -Frequent neurochecks -Consult neurosurgery in a.m.  Suspected pituitary adenoma MRI showing enlarged pituitary with partial effacement of suprasellar cistern, suspected underlying pituitary adenoma. -Patient will need a pituitary MRI for further evaluation.  Mild hyponatremia Sodium 130.  Not on a thiazide diuretic.  Patient does not endorse vomiting, diarrhea, or decreased p.o. intake. -IV fluid hydration -Continue to monitor BMP -Check serum osmolarity  QTC prolongation on EKG -Keep K >4 and Mag >2 -Cardiac monitoring -Repeat EKG in a.m. -Avoid QT prolonging drugs  Alcohol use Patient reports drinking beer 5 times a week.  Currently not displaying any  signs of withdrawal. -CIWA protocol; Ativan PRN -Thiamine, folate, multivitamin  DVT prophylaxis: Lovenox Code Status: Patient wishes to be DNR. Family Communication: No family available. Disposition Plan: Anticipate discharge after clinical improvement. Consults  called: Neurology Admission status: It is my clinical opinion that referral for OBSERVATION is reasonable and necessary in this patient based on the above information provided. The aforementioned taken together are felt to place the patient at high risk for further clinical deterioration. However it is anticipated that the patient may be medically stable for discharge from the hospital within 24 to 48 hours.  The medical decision making on this patient was of high complexity and the patient is at high risk for clinical deterioration, therefore this is a level 3 visit.  Shela Leff MD [facility] Pager [phone]  If 7PM-7AM, please contact night-coverage [email or web address] Password [email or web address]  12/08/2018, 4:08 AM

## 2018-12-08 NOTE — Progress Notes (Addendum)
STROKE TEAM PROGRESS NOTE   INTERVAL HISTORY Patient sitting in chair, still complains of left face and left fingertip numbness.  Headache has been resolved.  Vitals:   12/08/18 0430 12/08/18 0500 12/08/18 0600 12/08/18 0736  BP: (!) 151/65 (!) 156/62 (!) 139/58 (!) 157/66  Pulse:  (!) 0 (!) 52 (!) 54  Resp: 11 14 14 18   Temp:  98.2 F (36.8 C) 98.1 F (36.7 C) 97.9 F (36.6 C)  TempSrc:  Oral Oral Oral  SpO2:  99% 98% 98%  Weight:      Height:        CBC:  Recent Labs  Lab 12/07/18 2216  WBC 6.5  NEUTROABS 3.7  HGB 11.8*  HCT 36.0*  MCV 91.1  PLT 419    Basic Metabolic Panel:  Recent Labs  Lab 12/07/18 2216 12/08/18 0503  NA 130* 130*  K 4.7 3.9  CL 97* 95*  CO2 26 27  GLUCOSE 103* 88  BUN 16 10  CREATININE 0.96 0.87  CALCIUM 9.4 8.9   Lipid Panel:     Component Value Date/Time   CHOL 162 12/08/2018 0503   TRIG 52 12/08/2018 0503   HDL 74 12/08/2018 0503   CHOLHDL 2.2 12/08/2018 0503   VLDL 10 12/08/2018 0503   LDLCALC 78 12/08/2018 0503   HgbA1c:  Lab Results  Component Value Date   HGBA1C 5.4 12/08/2018   Urine Drug Screen: No results found for: LABOPIA, COCAINSCRNUR, LABBENZ, AMPHETMU, THCU, LABBARB  Alcohol Level No results found for: ETH  IMAGING Ct Angio Head W Or Wo Contrast  Result Date: 12/08/2018 CLINICAL DATA:  85 y/o M; transient left hand and face numbness with blurry vision. EXAM: CT ANGIOGRAPHY HEAD AND NECK TECHNIQUE: Multidetector CT imaging of the head and neck was performed using the standard protocol during bolus administration of intravenous contrast. Multiplanar CT image reconstructions and MIPs were obtained to evaluate the vascular anatomy. Carotid stenosis measurements (when applicable) are obtained utilizing NASCET criteria, using the distal internal carotid diameter as the denominator. CONTRAST:  36mL OMNIPAQUE IOHEXOL 350 MG/ML SOLN COMPARISON:  03/13/2014 CT head. FINDINGS: CT HEAD FINDINGS Brain: No evidence of acute  infarction, hemorrhage, hydrocephalus, extra-axial collection or herniation. Stable chronic microvascular ischemic changes and volume loss of the brain. Stable left anterolateral frontal region hyperdense extra-axial mass measuring 13 x 30 mm (AP by ML series 4, image 21) compatible with meningioma. Mild associated mass effect on the left frontal lobe with sulcal crowding. No adjacent brain edema. Stable enlargement of the pituitary gland and infundibulum with partial effacement of the suprasellar cistern. Vascular: As below. Skull: Normal. Negative for fracture or focal lesion. Sinuses: Imaged portions are clear. Orbits: No acute finding. Review of the MIP images confirms the above findings CTA NECK FINDINGS Aortic arch: Standard branching. Imaged portion shows no evidence of aneurysm or dissection. No significant stenosis of the major arch vessel origins. Aortic calcific atherosclerosis. Right carotid system: No evidence of dissection, stenosis (50% or greater) or occlusion. Left carotid system: Fibrofatty plaque of left carotid bifurcation with 60% moderate proximal ICA stenosis. Vertebral arteries: Right dominant. No evidence of dissection, stenosis (50% or greater) or occlusion. Skeleton: No acute osseous abnormality. Mild-to-moderate cervical spondylosis with multilevel disc and facet degenerative changes. No high-grade bony spinal canal stenosis. Other neck: Negative. Upper chest: Negative. Review of the MIP images confirms the above findings CTA HEAD FINDINGS Anterior circulation: Non stenotic calcific atherosclerosis of the carotid siphons. No significant stenosis, proximal occlusion, aneurysm, or  vascular malformation. Posterior circulation: Mild lower basilar stenosis. Otherwise no significant stenosis, proximal occlusion, aneurysm, or vascular malformation. Venous sinuses: As permitted by contrast timing, patent. Anatomic variants: Left dominant vertebrobasilar system. Left vertebral artery terminates in  left PICA. Fetal left PCA. Large left A1 and anterior communicating artery with hypoplastic right A1, normal variant. Review of the MIP images confirms the above findings IMPRESSION: CT head: 1. No acute intracranial abnormality identified. 2. Stable left anterolateral frontal region hyperdense extra-axial mass compatible with meningioma. 3. Stable fullness of the pituitary fossa with partial effacement of suprasellar cistern, possibly representing underlying pituitary adenoma. This can be further characterized with pituitary MRI without and with contrast on a nonemergent basis. 4. Stable chronic microvascular ischemic changes and parenchymal volume loss of the brain. CTA neck: 1. Fibrofatty plaque of left carotid bifurcation with 60% moderate proximal ICA stenosis. 2. Patent right carotid system and bilateral vertebral arteries. No dissection, aneurysm, or hemodynamically significant stenosis utilizing NASCET criteria. CTA head: Patent anterior and posterior intracranial circulation. No large vessel occlusion, aneurysm, or significant stenosis is identified. Electronically Signed   By: Kristine Garbe M.D.   On: 12/08/2018 00:12   Ct Angio Neck W And/or Wo Contrast  Result Date: 12/08/2018 CLINICAL DATA:  85 y/o M; transient left hand and face numbness with blurry vision. EXAM: CT ANGIOGRAPHY HEAD AND NECK TECHNIQUE: Multidetector CT imaging of the head and neck was performed using the standard protocol during bolus administration of intravenous contrast. Multiplanar CT image reconstructions and MIPs were obtained to evaluate the vascular anatomy. Carotid stenosis measurements (when applicable) are obtained utilizing NASCET criteria, using the distal internal carotid diameter as the denominator. CONTRAST:  67mL OMNIPAQUE IOHEXOL 350 MG/ML SOLN COMPARISON:  03/13/2014 CT head. FINDINGS: CT HEAD FINDINGS Brain: No evidence of acute infarction, hemorrhage, hydrocephalus, extra-axial collection or  herniation. Stable chronic microvascular ischemic changes and volume loss of the brain. Stable left anterolateral frontal region hyperdense extra-axial mass measuring 13 x 30 mm (AP by ML series 4, image 21) compatible with meningioma. Mild associated mass effect on the left frontal lobe with sulcal crowding. No adjacent brain edema. Stable enlargement of the pituitary gland and infundibulum with partial effacement of the suprasellar cistern. Vascular: As below. Skull: Normal. Negative for fracture or focal lesion. Sinuses: Imaged portions are clear. Orbits: No acute finding. Review of the MIP images confirms the above findings CTA NECK FINDINGS Aortic arch: Standard branching. Imaged portion shows no evidence of aneurysm or dissection. No significant stenosis of the major arch vessel origins. Aortic calcific atherosclerosis. Right carotid system: No evidence of dissection, stenosis (50% or greater) or occlusion. Left carotid system: Fibrofatty plaque of left carotid bifurcation with 60% moderate proximal ICA stenosis. Vertebral arteries: Right dominant. No evidence of dissection, stenosis (50% or greater) or occlusion. Skeleton: No acute osseous abnormality. Mild-to-moderate cervical spondylosis with multilevel disc and facet degenerative changes. No high-grade bony spinal canal stenosis. Other neck: Negative. Upper chest: Negative. Review of the MIP images confirms the above findings CTA HEAD FINDINGS Anterior circulation: Non stenotic calcific atherosclerosis of the carotid siphons. No significant stenosis, proximal occlusion, aneurysm, or vascular malformation. Posterior circulation: Mild lower basilar stenosis. Otherwise no significant stenosis, proximal occlusion, aneurysm, or vascular malformation. Venous sinuses: As permitted by contrast timing, patent. Anatomic variants: Left dominant vertebrobasilar system. Left vertebral artery terminates in left PICA. Fetal left PCA. Large left A1 and anterior  communicating artery with hypoplastic right A1, normal variant. Review of the MIP images confirms the above  findings IMPRESSION: CT head: 1. No acute intracranial abnormality identified. 2. Stable left anterolateral frontal region hyperdense extra-axial mass compatible with meningioma. 3. Stable fullness of the pituitary fossa with partial effacement of suprasellar cistern, possibly representing underlying pituitary adenoma. This can be further characterized with pituitary MRI without and with contrast on a nonemergent basis. 4. Stable chronic microvascular ischemic changes and parenchymal volume loss of the brain. CTA neck: 1. Fibrofatty plaque of left carotid bifurcation with 60% moderate proximal ICA stenosis. 2. Patent right carotid system and bilateral vertebral arteries. No dissection, aneurysm, or hemodynamically significant stenosis utilizing NASCET criteria. CTA head: Patent anterior and posterior intracranial circulation. No large vessel occlusion, aneurysm, or significant stenosis is identified. Electronically Signed   By: Kristine Garbe M.D.   On: 12/08/2018 00:12   Mr Brain Wo Contrast  Result Date: 12/08/2018 CLINICAL DATA:  85 y/o M; headache, vision changes, left-sided numbness. EXAM: MRI HEAD WITHOUT CONTRAST TECHNIQUE: Multiplanar, multiecho pulse sequences of the brain and surrounding structures were obtained without intravenous contrast. COMPARISON:  12/07/2018 CT head. FINDINGS: Brain: 6 mm focus of reduced diffusion within the right lateral thalamus (series 9, image 71) compatible with acute/early subacute infarction. No associated hemorrhage or mass effect. No extra-axial collection, hydrocephalus, intracranial hemorrhage, or herniation. Early confluent nonspecific T2 FLAIR hyperintensities in subcortical and periventricular white matter are compatible with moderate chronic microvascular ischemic changes. Moderate volume loss of the brain. The pituitary gland is enlarged  measuring up to 13 mm craniocaudal. Partial effacement of suprasellar cistern. 30 x 13 mm meningioma in the left anterior frontal region (series 14, image 14). Very mild mass effect on the underlying left frontal lobe. No left frontal lobe edema. Vascular: Normal flow voids. Skull and upper cervical spine: Normal marrow signal. Sinuses/Orbits: Partial opacification of right mastoid air cells. No abnormal signal of paranasal sinuses or left mastoid air cells. Other: None. IMPRESSION: 1. 6 mm acute/early subacute infarction within right lateral thalamus. No hemorrhage or mass effect. 2. Left anterior frontal meningioma measuring up to 30 mm with mild mass effect on the underlying left frontal lobe. No edema in the left frontal lobe. 3. Moderate chronic microvascular ischemic changes and volume loss of the brain. 4. Enlarged pituitary with partial effacement of suprasellar cistern, suspected underlying pituitary adenoma. This can be further characterized with pituitary protocol MRI with and without contrast. These results were called by telephone at the time of interpretation on 12/08/2018 at 2:40 am to Dr. Leonides Schanz, who verbally acknowledged these results. Electronically Signed   By: Kristine Garbe M.D.   On: 12/08/2018 02:42    PHYSICAL EXAM  Temp:  [97.7 F (36.5 C)-98.6 F (37 C)] 97.7 F (36.5 C) (06/17 1142) Pulse Rate:  [0-83] 54 (06/17 1142) Resp:  [11-21] 18 (06/17 1142) BP: (135-184)/(58-96) 154/62 (06/17 1142) SpO2:  [95 %-100 %] 97 % (06/17 1142) FiO2 (%):  [21 %] 21 % (06/17 0327) Weight:  [71.7 kg] 71.7 kg (06/16 2147)  General - Well nourished, well developed, in no apparent distress.  Ophthalmologic - fundi not visualized due to noncooperation.  Cardiovascular - Regular rate and rhythm.  Mental Status -  Level of arousal and orientation to time, place, and person were intact. Language including expression, naming, repetition, comprehension was assessed and found  intact. Fund of Knowledge was assessed and was intact.  Cranial Nerves II - XII - II - Visual field intact OU. III, IV, VI - Extraocular movements intact. V - Facial sensation decreased on the left.  VII - Facial movement intact bilaterally. VIII - Hearing & vestibular intact bilaterally. X - Palate elevates symmetrically. XI - Chin turning & shoulder shrug intact bilaterally. XII - Tongue protrusion intact.  Motor Strength - The patient's strength was normal in all extremities and pronator drift was absent.  Bulk was normal and fasciculations were absent.   Motor Tone - Muscle tone was assessed at the neck and appendages and was normal.  Reflexes - The patient's reflexes were symmetrical in all extremities and he had no pathological reflexes.  Sensory - Light touch, temperature/pinprick were assessed and were symmetrical except decreased sensation at the left fingertips  Coordination - The patient had normal movements in the hands and feet with no ataxia or dysmetria.  Tremor was absent.  Gait and Station - deferred.   ASSESSMENT/PLAN Mr. Clayton Odonnell is a 85 y.o. male with history of HTN, HLD presenting with transient blurred vision in the OS and numbness in the L hand and face with HA.   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 Stable L frontal meningioma. Fullness of pituitary fossa, ? Pituitary adenoma. Stable small vessel disease and atrophy.      CTA head Unremarkable   CTA neck fibrofatty plaque L ICA  Bifurcation 60%  MRI  R lateral thalamic infarct. L anterior frontal meningioma 29mm w/ mild mass effect on L frontal lobe. Enlarged pituitary w/ partial effacement suprasellar cistern, suspect pituitary adenoma.   2D Echo pending  LDL 78  HgbA1c 5.4  Lovenox 40 mg sq daily for VTE prophylaxis  No antithrombotic prior to admission, now on aspirin 81 mg daily and clopidogrel 75 mg daily. Continue DAPT x 3  months then aspirin alone   Therapy recommendations:  HH OT, outpt PT  Disposition:  pending   Meningioma, malignant ??  L frontal meningioma seen on imaging, however, not present on imaging 5 yrs ago  Concerning for malignancy given fast growth  Recommend OP neurosurgery follow up  Suspected Pituitary Adenoma  Sellar fullness seen on imaging on CT  MRI concerning for pituitary adenoma  Recommend OP pituitary protocol MRI with and without contrast  Recommend OP neurosurgery follow up  Left carotid stenosis with soft plaque  fibrofatty plaque of left carotid bifurcation with 60% moderate proximal ICA stenosis  Asymptomatic this time  Recommend [facility] outpt follow up  Hypertension  Stable . Permissive hypertension (OK if < 220/120) but gradually normalize in 2-3 days . Long-term BP goal normotensive  Hyperlipidemia  Home meds:  mevacor 10  Now on pravachol 20  LDL 78, goal < 70  Continue statin at discharge  Other Stroke Risk Factors  Advanced age  Former Cigarette smoker, quit 65 yrs agi  ETOH use, advised to drink no more than 2 drink(s) a day  Other Active Problems  Mild hyponatremia  OTC prolongation on EKG  Hospital day # 0  Neurology will sign off. Please call with questions. Pt will follow up with stroke clinic NP at [facility] in about 4 weeks. Thanks for the consult.  Rosalin Hawking, MD PhD Stroke Neurology 12/08/2018 3:21 PM   To contact Stroke Continuity provider, please refer to [email or web address] After hours, contact General Neurology

## 2018-12-08 NOTE — ED Notes (Signed)
ED TO INPATIENT HANDOFF REPORT  ED Nurse Name and Phone #: [phone]  S Name/Age/Gender Clayton Odonnell 85 y.o. male Room/Bed: 028C/028C  Code Status   Code Status: DNR  Home/SNF/Other Home Patient oriented to: self, place, time and situation Is this baseline? Yes   Triage Complete: Triage complete  Chief Complaint TIA  Triage Note [facility]- pt arrives from home. Pt complains around 730 tonight of blurred vision and numbness to the left hand and face. Symptoms resolved before EMS arrived. EMS reports a negative stroke screen.   170/84 BP 60 HR 114 CBG 16 RR   Allergies Allergies  Allergen Reactions  . Tetanus Toxoids   . Penicillins Hives and Rash    Did it involve swelling of the face/tongue/throat, SOB, or low BP? No Did it involve sudden or severe rash/hives, skin peeling, or any reaction on the inside of your mouth or nose? Yes Did you need to seek medical attention at a hospital or doctor's office? No When did it last happen?Unknown If all above answers are "NO", may proceed with cephalosporin use.     Level of Care/Admitting Diagnosis ED Disposition    ED Disposition Condition [facility] Area: [place] [100100]  Level of Care: Telemetry Medical [104]  I expect the patient will be discharged within 24 hours: No (not a candidate for 5C-Observation unit)  Covid Evaluation: Person Under Investigation (PUI)  Isolation Risk Level: Low Risk/Droplet (Less than 4L [place] supplementation)  Diagnosis: Acute CVA (cerebrovascular accident) [facility]) [[id number]]  Admitting Physician: Shela Leff [[id number]]  Attending Physician: Shela Leff [[id number]]  PT Class (Do Not Modify): Observation [104]  PT Acc Code (Do Not Modify): Observation [10022]       B Medical/Surgery History Past Medical History:  Diagnosis Date  . Hyperlipemia   . Hypertension   . Pneumonia   . Transfusion of blood product refused for religious  reason    Past Surgical History:  Procedure Laterality Date  . FEMUR IM NAIL Left 03/14/2014   Procedure: INTRAMEDULLARY (IM) NAIL FEMORAL;  Surgeon: Renette Butters, MD;  Location: [place];  Service: Orthopedics;  Laterality: Left;  . TONSILLECTOMY       A IV Location/Drains/Wounds Patient Lines/Drains/Airways Status   Active Line/Drains/Airways    Name:   Placement date:   Placement time:   Site:   Days:   Peripheral IV 12/07/18 Left Antecubital   12/07/18    2258    Antecubital   1   Incision (Closed) 03/14/14 Leg Left   03/14/14    1258     1730   Incision (Closed) 03/14/14 Arm Left   03/14/14    1259     1730   Wound / Incision (Open or Dehisced) 03/14/14 Other (Comment) Elbow Left   03/14/14    0209    Elbow   1730          Intake/Output Last 24 hours No intake or output data in the 24 hours ending 12/08/18 0430  Labs/Imaging Results for orders placed or performed during the hospital encounter of 12/07/18 (from the past 48 hour(s))  Comprehensive metabolic panel     Status: Abnormal   Collection Time: 12/07/18 10:16 PM  Result Value Ref Range   Sodium 130 (L) 135 - 145 mmol/L   Potassium 4.7 3.5 - 5.1 mmol/L   Chloride 97 (L) 98 - 111 mmol/L   CO2 26 22 - 32 mmol/L   Glucose, Bld 103 (  H) 70 - 99 mg/dL   BUN 16 8 - 23 mg/dL   Creatinine, Ser 0.96 0.61 - 1.24 mg/dL   Calcium 9.4 8.9 - 10.3 mg/dL   Total Protein 6.5 6.5 - 8.1 g/dL   Albumin 4.0 3.5 - 5.0 g/dL   AST 30 15 - 41 U/L   ALT 18 0 - 44 U/L   Alkaline Phosphatase 35 (L) 38 - 126 U/L   Total Bilirubin 0.9 0.3 - 1.2 mg/dL   GFR calc non Af Amer >60 >60 mL/min   GFR calc Af Amer >60 >60 mL/min   Anion gap 7 5 - 15    Comment: Performed at [facility], [place] [address], [place], [place] [address]  CBC with Differential     Status: Abnormal   Collection Time: 12/07/18 10:16 PM  Result Value Ref Range   WBC 6.5 4.0 - 10.5 K/uL   RBC 3.95 (L) 4.22 - 5.81 MIL/uL   Hemoglobin 11.8 (L) 13.0 - 17.0 g/dL    HCT 36.0 (L) 39.0 - 52.0 %   MCV 91.1 80.0 - 100.0 fL   MCH 29.9 26.0 - 34.0 pg   MCHC 32.8 30.0 - 36.0 g/dL   RDW 12.7 11.5 - 15.5 %   Platelets 216 150 - 400 K/uL   nRBC 0.0 0.0 - 0.2 %   Neutrophils Relative % 57 %   Neutro Abs 3.7 1.7 - 7.7 K/uL   Lymphocytes Relative 27 %   Lymphs Abs 1.7 0.7 - 4.0 K/uL   Monocytes Relative 12 %   Monocytes Absolute 0.7 0.1 - 1.0 K/uL   Eosinophils Relative 3 %   Eosinophils Absolute 0.2 0.0 - 0.5 K/uL   Basophils Relative 1 %   Basophils Absolute 0.1 0.0 - 0.1 K/uL   Immature Granulocytes 0 %   Abs Immature Granulocytes 0.01 0.00 - 0.07 K/uL    Comment: Performed at [facility] [address], [place], [place] [address]  Urinalysis, Routine w reflex microscopic     Status: Abnormal   Collection Time: 12/07/18 11:31 PM  Result Value Ref Range   Color, Urine STRAW (A) YELLOW   APPearance CLEAR CLEAR   Specific Gravity, Urine 1.011 1.005 - 1.030   pH 8.0 5.0 - 8.0   Glucose, UA NEGATIVE NEGATIVE mg/dL   Hgb urine dipstick NEGATIVE NEGATIVE   Bilirubin Urine NEGATIVE NEGATIVE   Ketones, ur NEGATIVE NEGATIVE mg/dL   Protein, ur NEGATIVE NEGATIVE mg/dL   Nitrite NEGATIVE NEGATIVE   Leukocytes,Ua NEGATIVE NEGATIVE    Comment: Performed at [facility] [address], [place], [place] [address]   Ct Angio Head W Or Wo Contrast  Result Date: 12/08/2018 CLINICAL DATA:  85 y/o M; transient left hand and face numbness with blurry vision. EXAM: CT ANGIOGRAPHY HEAD AND NECK TECHNIQUE: Multidetector CT imaging of the head and neck was performed using the standard protocol during bolus administration of intravenous contrast. Multiplanar CT image reconstructions and MIPs were obtained to evaluate the vascular anatomy. Carotid stenosis measurements (when applicable) are obtained utilizing NASCET criteria, using the distal internal carotid diameter as the denominator. CONTRAST:  50mL OMNIPAQUE IOHEXOL 350 MG/ML SOLN COMPARISON:  03/13/2014 CT  head. FINDINGS: CT HEAD FINDINGS Brain: No evidence of acute infarction, hemorrhage, hydrocephalus, extra-axial collection or herniation. Stable chronic microvascular ischemic changes and volume loss of the brain. Stable left anterolateral frontal region hyperdense extra-axial mass measuring 13 x 30 mm (AP by ML series 4,  image 21) compatible with meningioma. Mild associated mass effect on the left frontal lobe with sulcal crowding. No adjacent brain edema. Stable enlargement of the pituitary gland and infundibulum with partial effacement of the suprasellar cistern. Vascular: As below. Skull: Normal. Negative for fracture or focal lesion. Sinuses: Imaged portions are clear. Orbits: No acute finding. Review of the MIP images confirms the above findings CTA NECK FINDINGS Aortic arch: Standard branching. Imaged portion shows no evidence of aneurysm or dissection. No significant stenosis of the major arch vessel origins. Aortic calcific atherosclerosis. Right carotid system: No evidence of dissection, stenosis (50% or greater) or occlusion. Left carotid system: Fibrofatty plaque of left carotid bifurcation with 60% moderate proximal ICA stenosis. Vertebral arteries: Right dominant. No evidence of dissection, stenosis (50% or greater) or occlusion. Skeleton: No acute osseous abnormality. Mild-to-moderate cervical spondylosis with multilevel disc and facet degenerative changes. No high-grade bony spinal canal stenosis. Other neck: Negative. Upper chest: Negative. Review of the MIP images confirms the above findings CTA HEAD FINDINGS Anterior circulation: Non stenotic calcific atherosclerosis of the carotid siphons. No significant stenosis, proximal occlusion, aneurysm, or vascular malformation. Posterior circulation: Mild lower basilar stenosis. Otherwise no significant stenosis, proximal occlusion, aneurysm, or vascular malformation. Venous sinuses: As permitted by contrast timing, patent. Anatomic variants: Left  dominant vertebrobasilar system. Left vertebral artery terminates in left PICA. Fetal left PCA. Large left A1 and anterior communicating artery with hypoplastic right A1, normal variant. Review of the MIP images confirms the above findings IMPRESSION: CT head: 1. No acute intracranial abnormality identified. 2. Stable left anterolateral frontal region hyperdense extra-axial mass compatible with meningioma. 3. Stable fullness of the pituitary fossa with partial effacement of suprasellar cistern, possibly representing underlying pituitary adenoma. This can be further characterized with pituitary MRI without and with contrast on a nonemergent basis. 4. Stable chronic microvascular ischemic changes and parenchymal volume loss of the brain. CTA neck: 1. Fibrofatty plaque of left carotid bifurcation with 60% moderate proximal ICA stenosis. 2. Patent right carotid system and bilateral vertebral arteries. No dissection, aneurysm, or hemodynamically significant stenosis utilizing NASCET criteria. CTA head: Patent anterior and posterior intracranial circulation. No large vessel occlusion, aneurysm, or significant stenosis is identified. Electronically Signed   By: Kristine Garbe M.D.   On: 12/08/2018 00:12   Ct Angio Neck W And/or Wo Contrast  Result Date: 12/08/2018 CLINICAL DATA:  85 y/o M; transient left hand and face numbness with blurry vision. EXAM: CT ANGIOGRAPHY HEAD AND NECK TECHNIQUE: Multidetector CT imaging of the head and neck was performed using the standard protocol during bolus administration of intravenous contrast. Multiplanar CT image reconstructions and MIPs were obtained to evaluate the vascular anatomy. Carotid stenosis measurements (when applicable) are obtained utilizing NASCET criteria, using the distal internal carotid diameter as the denominator. CONTRAST:  44mL OMNIPAQUE IOHEXOL 350 MG/ML SOLN COMPARISON:  03/13/2014 CT head. FINDINGS: CT HEAD FINDINGS Brain: No evidence of acute  infarction, hemorrhage, hydrocephalus, extra-axial collection or herniation. Stable chronic microvascular ischemic changes and volume loss of the brain. Stable left anterolateral frontal region hyperdense extra-axial mass measuring 13 x 30 mm (AP by ML series 4, image 21) compatible with meningioma. Mild associated mass effect on the left frontal lobe with sulcal crowding. No adjacent brain edema. Stable enlargement of the pituitary gland and infundibulum with partial effacement of the suprasellar cistern. Vascular: As below. Skull: Normal. Negative for fracture or focal lesion. Sinuses: Imaged portions are clear. Orbits: No acute finding. Review of the MIP images confirms the above  findings CTA NECK FINDINGS Aortic arch: Standard branching. Imaged portion shows no evidence of aneurysm or dissection. No significant stenosis of the major arch vessel origins. Aortic calcific atherosclerosis. Right carotid system: No evidence of dissection, stenosis (50% or greater) or occlusion. Left carotid system: Fibrofatty plaque of left carotid bifurcation with 60% moderate proximal ICA stenosis. Vertebral arteries: Right dominant. No evidence of dissection, stenosis (50% or greater) or occlusion. Skeleton: No acute osseous abnormality. Mild-to-moderate cervical spondylosis with multilevel disc and facet degenerative changes. No high-grade bony spinal canal stenosis. Other neck: Negative. Upper chest: Negative. Review of the MIP images confirms the above findings CTA HEAD FINDINGS Anterior circulation: Non stenotic calcific atherosclerosis of the carotid siphons. No significant stenosis, proximal occlusion, aneurysm, or vascular malformation. Posterior circulation: Mild lower basilar stenosis. Otherwise no significant stenosis, proximal occlusion, aneurysm, or vascular malformation. Venous sinuses: As permitted by contrast timing, patent. Anatomic variants: Left dominant vertebrobasilar system. Left vertebral artery terminates in  left PICA. Fetal left PCA. Large left A1 and anterior communicating artery with hypoplastic right A1, normal variant. Review of the MIP images confirms the above findings IMPRESSION: CT head: 1. No acute intracranial abnormality identified. 2. Stable left anterolateral frontal region hyperdense extra-axial mass compatible with meningioma. 3. Stable fullness of the pituitary fossa with partial effacement of suprasellar cistern, possibly representing underlying pituitary adenoma. This can be further characterized with pituitary MRI without and with contrast on a nonemergent basis. 4. Stable chronic microvascular ischemic changes and parenchymal volume loss of the brain. CTA neck: 1. Fibrofatty plaque of left carotid bifurcation with 60% moderate proximal ICA stenosis. 2. Patent right carotid system and bilateral vertebral arteries. No dissection, aneurysm, or hemodynamically significant stenosis utilizing NASCET criteria. CTA head: Patent anterior and posterior intracranial circulation. No large vessel occlusion, aneurysm, or significant stenosis is identified. Electronically Signed   By: Kristine Garbe M.D.   On: 12/08/2018 00:12   Mr Brain Wo Contrast  Result Date: 12/08/2018 CLINICAL DATA:  85 y/o M; headache, vision changes, left-sided numbness. EXAM: MRI HEAD WITHOUT CONTRAST TECHNIQUE: Multiplanar, multiecho pulse sequences of the brain and surrounding structures were obtained without intravenous contrast. COMPARISON:  12/07/2018 CT head. FINDINGS: Brain: 6 mm focus of reduced diffusion within the right lateral thalamus (series 9, image 71) compatible with acute/early subacute infarction. No associated hemorrhage or mass effect. No extra-axial collection, hydrocephalus, intracranial hemorrhage, or herniation. Early confluent nonspecific T2 FLAIR hyperintensities in subcortical and periventricular white matter are compatible with moderate chronic microvascular ischemic changes. Moderate volume loss  of the brain. The pituitary gland is enlarged measuring up to 13 mm craniocaudal. Partial effacement of suprasellar cistern. 30 x 13 mm meningioma in the left anterior frontal region (series 14, image 14). Very mild mass effect on the underlying left frontal lobe. No left frontal lobe edema. Vascular: Normal flow voids. Skull and upper cervical spine: Normal marrow signal. Sinuses/Orbits: Partial opacification of right mastoid air cells. No abnormal signal of paranasal sinuses or left mastoid air cells. Other: None. IMPRESSION: 1. 6 mm acute/early subacute infarction within right lateral thalamus. No hemorrhage or mass effect. 2. Left anterior frontal meningioma measuring up to 30 mm with mild mass effect on the underlying left frontal lobe. No edema in the left frontal lobe. 3. Moderate chronic microvascular ischemic changes and volume loss of the brain. 4. Enlarged pituitary with partial effacement of suprasellar cistern, suspected underlying pituitary adenoma. This can be further characterized with pituitary protocol MRI with and without contrast. These results were called  by telephone at the time of interpretation on 12/08/2018 at 2:40 am to Dr. Leonides Schanz, who verbally acknowledged these results. Electronically Signed   By: Kristine Garbe M.D.   On: 12/08/2018 02:42    Pending Labs Unresulted Labs (From admission, onward)    Start     Ordered   12/08/18 0500  Hemoglobin A1c  Tomorrow morning,   R     12/08/18 0331   12/08/18 0500  Lipid panel  Tomorrow morning,   R    Comments: Fasting    12/08/18 0331   12/08/18 7824  Basic metabolic panel  Tomorrow morning,   R     12/08/18 0331   12/08/18 0331  Osmolality  Once,   STAT     12/08/18 0331   12/08/18 0256  Novel Coronavirus,NAA,(SEND-OUT TO REF LAB - TAT 24-48 hrs); Hosp Order  (Asymptomatic Patients Labs)  ONCE - STAT,   STAT    Question:  Rule Out  Answer:  Yes   12/08/18 0255          Vitals/Pain Today's Vitals   12/08/18 0030  12/08/18 0045 12/08/18 0300 12/08/18 0301  BP: (!) 147/62 (!) 142/74  (!) 178/76  Pulse: (!) 53 (!) 51  60  Resp: 13 12  15   Temp:    98.6 F (37 C)  TempSrc:    Oral  SpO2: 96% 97%  95%  Weight:      Height:      PainSc:   8      Isolation Precautions No active isolations  Medications Medications   stroke: mapping our early stages of recovery book (has no administration in time range)  acetaminophen (TYLENOL) tablet 650 mg (has no administration in time range)    Or  acetaminophen (TYLENOL) solution 650 mg (has no administration in time range)    Or  acetaminophen (TYLENOL) suppository 650 mg (has no administration in time range)  enoxaparin (LOVENOX) injection 40 mg (has no administration in time range)  aspirin tablet 325 mg (has no administration in time range)  atorvastatin (LIPITOR) tablet 80 mg (has no administration in time range)  0.9 %  sodium chloride infusion (has no administration in time range)  LORazepam (ATIVAN) tablet 1 mg (has no administration in time range)    Or  LORazepam (ATIVAN) injection 1 mg (has no administration in time range)  thiamine (VITAMIN B-1) tablet 100 mg (has no administration in time range)    Or  thiamine (B-1) injection 100 mg (has no administration in time range)  folic acid (FOLVITE) tablet 1 mg (has no administration in time range)  multivitamin with minerals tablet 1 tablet (has no administration in time range)  iohexol (OMNIPAQUE) 350 MG/ML injection 75 mL (75 mLs Intravenous Contrast Given 12/07/18 2326)  aspirin chewable tablet 324 mg (324 mg Oral Given 12/08/18 0259)  acetaminophen (TYLENOL) tablet 1,000 mg (1,000 mg Oral Given 12/08/18 0305)    Mobility walks Low fall risk   Focused Assessments Neuro Assessment Handoff:  Swallow screen pass? Yes    NIH Stroke Scale ( + Modified Stroke Scale Criteria)  Interval: Initial Level of Consciousness (1a.)   : Alert, keenly responsive LOC Questions (1b. )   +: Answers both  questions correctly LOC Commands (1c. )   + : Performs both tasks correctly Best Gaze (2. )  +: Normal Visual (3. )  +: No visual loss Facial Palsy (4. )    : Normal symmetrical movements Motor Arm, Left (5a. )   +:  No drift Motor Arm, Right (5b. )   +: No drift Motor Leg, Left (6a. )   +: No drift Motor Leg, Right (6b. )   +: No drift Limb Ataxia (7. ): Absent Sensory (8. )   +: Mild-to-moderate sensory loss, patient feels pinprick is less sharp or is dull on the affected side, or there is a loss of superficial pain with pinprick, but patient is aware of being touched Best Language (9. )   +: No aphasia Dysarthria (10. ): Normal Extinction/Inattention (11.)   +: No Abnormality Modified SS Total  +: 1 Complete NIHSS TOTAL: 1     Neuro Assessment:   Neuro Checks:   Initial (12/07/18 2156)  Last Documented NIHSS Modified Score: 1 (12/08/18 0053) Has TPA been given? No If patient is a Neuro Trauma and patient is going to OR before floor call report to [place] nurse: [phone] or [phone]     R Recommendations: See Admitting Provider Note  Report given to:   Additional Notes:

## 2018-12-08 NOTE — Evaluation (Signed)
Physical Therapy Evaluation Patient Details Name: Clayton Odonnell MRN: [id number] DOB: [birth year] Today's Date: 12/08/2018   History of Present Illness  Pt is an 85 y/o male with a PMH significant for HTN, L femur IM nail 2015. He presents to the ED via EMS with vision changes and L-side sensory changes in face, hand, and lower leg/foot. 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  Clinical Impression  Pt admitted with above diagnosis. Pt currently with functional limitations due to the deficits listed below (see PT Problem List). At the time of PT eval pt was able to perform transfers and ambulation with gross modified independence to supervision for safety without an AD. Pt with mild strength deficits (mainly in L ankle DF), and mild coordination deficits in RUE. Pt reports he can have family support 24 hours at home if needbe. Recommend outpatient PT follow-up for higher level balance activity and coordination training. Acutely, pt will benefit from skilled PT to increase their independence and safety with mobility to allow discharge to the venue listed below.       Follow Up Recommendations Outpatient PT;Supervision - Intermittent    Equipment Recommendations  None recommended by PT    Recommendations for Other Services       Precautions / Restrictions Precautions Precautions: Fall Restrictions Weight Bearing Restrictions: No      Mobility  Bed Mobility Overal bed mobility: Modified Independent Bed Mobility: Supine to Sit           General bed mobility comments: No assist required.  Transfers Overall transfer level: Modified independent Equipment used: None             General transfer comment: pt powered-up to full stand without assistance and without LOB  Ambulation/Gait Ambulation/Gait assistance:  Supervision Gait Distance (Feet): 300 Feet Assistive device: None Gait Pattern/deviations: Step-through pattern;Decreased stride length;Trunk flexed Gait velocity: Decreased Gait velocity interpretation: 1.31 - 2.62 ft/sec, indicative of limited community ambulator General Gait Details: VC's for improved posture. No overt LOB noted however pt occasionally appeared mildly unsteady.   Stairs            Wheelchair Mobility    Modified Rankin (Stroke Patients Only) Modified Rankin (Stroke Patients Only) Pre-Morbid Rankin Score: No symptoms Modified Rankin: Moderately severe disability     Balance Overall balance assessment: Mild deficits observed, not formally tested                                           Pertinent Vitals/Pain Pain Assessment: 0-10 Pain Score: 3  Pain Descriptors / Indicators: Headache;Nagging Pain Intervention(s): Limited activity within patient's tolerance;Monitored during session;Repositioned    Home Living Family/patient expects to be discharged to:: Private residence Living Arrangements: Alone Available Help at Discharge: Family;Available 24 hours/day Type of Home: Mobile home Home Access: Ramped entrance     Home Layout: One level Home Equipment: Shower seat;Cane - single point;Walker - 2 wheels;Wheelchair - manual      Prior Function Level of Independence: Independent         Comments: Drives, daughters do grocery shopping 2 pandemic.      Hand Dominance   Dominant Hand: Right    Extremity/Trunk Assessment   Upper Extremity Assessment Upper Extremity Assessment: LUE deficits/detail LUE  Deficits / Details: Strength 5/5 in shoulder flexion, biceps, triceps. Noted decreased coordination in fine motor in hand, and finger-to-nose.  LUE Sensation: decreased light touch(fingers, not palm) LUE Coordination: decreased fine motor    Lower Extremity Assessment Lower Extremity Assessment: LLE deficits/detail LLE  Deficits / Details: Decreased ankle DF 4/5 but otherwise 5/5 strength in hip flexors, quads, hamstrings. Pt reports decreased sensation in medial lower leg and foot LLE Sensation: decreased light touch    Cervical / Trunk Assessment Cervical / Trunk Assessment: Normal(Forward head posture with rounded shoulders)  Communication   Communication: No difficulties  Cognition Arousal/Alertness: Awake/alert Behavior During Therapy: WFL for tasks assessed/performed Overall Cognitive Status: Within Functional Limits for tasks assessed                                        General Comments      Exercises     Assessment/Plan    PT Assessment Patient needs continued PT services  PT Problem List Decreased strength;Decreased activity tolerance;Decreased balance;Decreased mobility;Decreased knowledge of use of DME;Decreased safety awareness;Decreased knowledge of precautions       PT Treatment Interventions DME instruction;Gait training;Stair training;Functional mobility training;Therapeutic activities;Therapeutic exercise;Neuromuscular re-education;Patient/family education    PT Goals (Current goals can be found in the Care Plan section)  Acute Rehab PT Goals Patient Stated Goal: Back home PT Goal Formulation: With patient Time For Goal Achievement: 12/15/18 Potential to Achieve Goals: Good    Frequency Min 4X/week   Barriers to discharge        Co-evaluation               AM-PAC PT "6 Clicks" Mobility  Outcome Measure Help needed turning from your back to your side while in a flat bed without using bedrails?: None Help needed moving from lying on your back to sitting on the side of a flat bed without using bedrails?: None Help needed moving to and from a bed to a chair (including a wheelchair)?: None Help needed standing up from a chair using your arms (e.g., wheelchair or bedside chair)?: None Help needed to walk in hospital room?: A Little Help needed  climbing 3-5 steps with a railing? : A Little 6 Click Score: 22    End of Session Equipment Utilized During Treatment: Gait belt Activity Tolerance: Patient tolerated treatment well Patient left: in chair;with call bell/phone within reach Nurse Communication: Mobility status PT Visit Diagnosis: Unsteadiness on feet (R26.81);Difficulty in walking, not elsewhere classified (R26.2);Other symptoms and signs involving the nervous system (O70.962)    Time: 8366-2947 PT Time Calculation (min) (ACUTE ONLY): 17 min   Charges:   PT Evaluation $PT Eval Moderate Complexity: 1 Mod          Rolinda Roan, PT, DPT Acute Rehabilitation Services Pager: [phone] Office: [phone]   Thelma Comp 12/08/2018, 11:35 AM

## 2018-12-08 NOTE — ED Notes (Signed)
Patient transported to MRI 

## 2018-12-08 NOTE — Consult Note (Addendum)
Referring Physician: Dr. Leonides Schanz    Chief Complaint: Numbness to left hand and face  HPI: KAMEN HANKEN is an 85 y.o. male who presented to the ED with complaints of blurred vision in left eye and numbness to the left hand and face. Symptom onset was at 7:30 PM on Tuesday evening. His symptoms resolved before EMS arrived. Vitals on arrival to the ED: 170/84 BP, 60 HR, 114 CBG, 16 RR. He also endorsed headache. He denies any other neurological symptoms.   MRI brain reveals an acute lacunar infarction within the right thalamus. Diffuse atrophy also noted.   CTA head/neck reveals no LVO. There is fibrofatty plaque of left carotid bifurcation with 60% moderate proximal ICA stenosis.   IV tPA was not administered due to completed stroke on MRI.   Past Medical History:  Diagnosis Date  . Hyperlipemia   . Hypertension   . Pneumonia   . Transfusion of blood product refused for religious reason     Past Surgical History:  Procedure Laterality Date  . FEMUR IM NAIL Left 03/14/2014   Procedure: INTRAMEDULLARY (IM) NAIL FEMORAL;  Surgeon: Renette Butters, MD;  Location: [facility];  Service: Orthopedics;  Laterality: Left;  . TONSILLECTOMY      Family History  Problem Relation Age of Onset  . CAD Brother   . Osteoporosis Daughter    Social History:  reports that he quit smoking about 65 years ago. His smoking use included cigarettes. He has never used smokeless tobacco. He reports current alcohol use of about 14.0 standard drinks of alcohol per week. He reports that he does not use drugs.  Allergies:  Allergies  Allergen Reactions  . Tetanus Toxoids   . Penicillins Hives and Rash    Did it involve swelling of the face/tongue/throat, SOB, or low BP? No Did it involve sudden or severe rash/hives, skin peeling, or any reaction on the inside of your mouth or nose? Yes Did you need to seek medical attention at a hospital or doctor's office? No When did it last happen?Unknown If all  above answers are "NO", may proceed with cephalosporin use.     Home Medications:  Tylenol Cozaar Lovastatin Mobic MVI  ROS: No CP or SOB. Other ROS as per HPI with all other systems negative.   Physical Examination: Blood pressure (!) 142/74, pulse (!) 51, temperature 98.2 F (36.8 C), temperature source Oral, resp. rate 12, height 5\' 9"  (1.753 m), weight 71.7 kg, SpO2 97 %.  HEENT: [place]/AT Lungs: Respirations unlabored Ext: No edema  Neurologic Examination: Mental Status: Alert, fully oriented, thought content appropriate.  Speech fluent without evidence of aphasia.  Able to follow all commands without difficulty. Cranial Nerves: II:  Visual fields intact with no extinction to DSS. PERRL. III,IV, VI: EOMI. No ptosis. No nystagmus.  V,VII: Smile symmetric, facial temp sensation equal bilaterally VIII: hearing intact to voice IX,X: No hypophonia XI: Symmetric XII: midline tongue extension  Motor: Right : Upper extremity   5/5    Left:     Upper extremity   5/5  Lower extremity   5/5     Lower extremity   5/5 Subtle drift of LUE Sensory: Temp and light touch intact x 4, except for extinction to LUE with DSS Deep Tendon Reflexes:  2+ brachioradialis, biceps and patellae bilaterally. 1+ achilles bilaterally.   Plantars: Right: downgoing   Left: downgoing Cerebellar: No ataxia with FNF bilaterally  Gait: Deferred  Results for orders placed or performed during the  hospital encounter of 12/07/18 (from the past 48 hour(s))  Comprehensive metabolic panel     Status: Abnormal   Collection Time: 12/07/18 10:16 PM  Result Value Ref Range   Sodium 130 (L) 135 - 145 mmol/L   Potassium 4.7 3.5 - 5.1 mmol/L   Chloride 97 (L) 98 - 111 mmol/L   CO2 26 22 - 32 mmol/L   Glucose, Bld 103 (H) 70 - 99 mg/dL   BUN 16 8 - 23 mg/dL   Creatinine, Ser 0.96 0.61 - 1.24 mg/dL   Calcium 9.4 8.9 - 10.3 mg/dL   Total Protein 6.5 6.5 - 8.1 g/dL   Albumin 4.0 3.5 - 5.0 g/dL   AST 30 15 - 41 U/L    ALT 18 0 - 44 U/L   Alkaline Phosphatase 35 (L) 38 - 126 U/L   Total Bilirubin 0.9 0.3 - 1.2 mg/dL   GFR calc non Af Amer >60 >60 mL/min   GFR calc Af Amer >60 >60 mL/min   Anion gap 7 5 - 15    Comment: Performed at [facility], [address] [place], [place] [address]  CBC with Differential     Status: Abnormal   Collection Time: 12/07/18 10:16 PM  Result Value Ref Range   WBC 6.5 4.0 - 10.5 K/uL   RBC 3.95 (L) 4.22 - 5.81 MIL/uL   Hemoglobin 11.8 (L) 13.0 - 17.0 g/dL   HCT 36.0 (L) 39.0 - 52.0 %   MCV 91.1 80.0 - 100.0 fL   MCH 29.9 26.0 - 34.0 pg   MCHC 32.8 30.0 - 36.0 g/dL   RDW 12.7 11.5 - 15.5 %   Platelets 216 150 - 400 K/uL   nRBC 0.0 0.0 - 0.2 %   Neutrophils Relative % 57 %   Neutro Abs 3.7 1.7 - 7.7 K/uL   Lymphocytes Relative 27 %   Lymphs Abs 1.7 0.7 - 4.0 K/uL   Monocytes Relative 12 %   Monocytes Absolute 0.7 0.1 - 1.0 K/uL   Eosinophils Relative 3 %   Eosinophils Absolute 0.2 0.0 - 0.5 K/uL   Basophils Relative 1 %   Basophils Absolute 0.1 0.0 - 0.1 K/uL   Immature Granulocytes 0 %   Abs Immature Granulocytes 0.01 0.00 - 0.07 K/uL    Comment: Performed at [facility] [address], [place], [place] [address]  Urinalysis, Routine w reflex microscopic     Status: Abnormal   Collection Time: 12/07/18 11:31 PM  Result Value Ref Range   Color, Urine STRAW (A) YELLOW   APPearance CLEAR CLEAR   Specific Gravity, Urine 1.011 1.005 - 1.030   pH 8.0 5.0 - 8.0   Glucose, UA NEGATIVE NEGATIVE mg/dL   Hgb urine dipstick NEGATIVE NEGATIVE   Bilirubin Urine NEGATIVE NEGATIVE   Ketones, ur NEGATIVE NEGATIVE mg/dL   Protein, ur NEGATIVE NEGATIVE mg/dL   Nitrite NEGATIVE NEGATIVE   Leukocytes,Ua NEGATIVE NEGATIVE    Comment: Performed at [facility] [address] [place], [place] [address]   Ct Angio Head W Or Wo Contrast  Result Date: 12/08/2018 CLINICAL DATA:  85 y/o M; transient left hand and face numbness with blurry vision. EXAM:  CT ANGIOGRAPHY HEAD AND NECK TECHNIQUE: Multidetector CT imaging of the head and neck was performed using the standard protocol during bolus administration of intravenous contrast. Multiplanar CT image reconstructions and MIPs were obtained to evaluate the vascular anatomy. Carotid stenosis measurements (when applicable) are obtained  utilizing NASCET criteria, using the distal internal carotid diameter as the denominator. CONTRAST:  43mL OMNIPAQUE IOHEXOL 350 MG/ML SOLN COMPARISON:  03/13/2014 CT head. FINDINGS: CT HEAD FINDINGS Brain: No evidence of acute infarction, hemorrhage, hydrocephalus, extra-axial collection or herniation. Stable chronic microvascular ischemic changes and volume loss of the brain. Stable left anterolateral frontal region hyperdense extra-axial mass measuring 13 x 30 mm (AP by ML series 4, image 21) compatible with meningioma. Mild associated mass effect on the left frontal lobe with sulcal crowding. No adjacent brain edema. Stable enlargement of the pituitary gland and infundibulum with partial effacement of the suprasellar cistern. Vascular: As below. Skull: Normal. Negative for fracture or focal lesion. Sinuses: Imaged portions are clear. Orbits: No acute finding. Review of the MIP images confirms the above findings CTA NECK FINDINGS Aortic arch: Standard branching. Imaged portion shows no evidence of aneurysm or dissection. No significant stenosis of the major arch vessel origins. Aortic calcific atherosclerosis. Right carotid system: No evidence of dissection, stenosis (50% or greater) or occlusion. Left carotid system: Fibrofatty plaque of left carotid bifurcation with 60% moderate proximal ICA stenosis. Vertebral arteries: Right dominant. No evidence of dissection, stenosis (50% or greater) or occlusion. Skeleton: No acute osseous abnormality. Mild-to-moderate cervical spondylosis with multilevel disc and facet degenerative changes. No high-grade bony spinal canal stenosis. Other  neck: Negative. Upper chest: Negative. Review of the MIP images confirms the above findings CTA HEAD FINDINGS Anterior circulation: Non stenotic calcific atherosclerosis of the carotid siphons. No significant stenosis, proximal occlusion, aneurysm, or vascular malformation. Posterior circulation: Mild lower basilar stenosis. Otherwise no significant stenosis, proximal occlusion, aneurysm, or vascular malformation. Venous sinuses: As permitted by contrast timing, patent. Anatomic variants: Left dominant vertebrobasilar system. Left vertebral artery terminates in left PICA. Fetal left PCA. Large left A1 and anterior communicating artery with hypoplastic right A1, normal variant. Review of the MIP images confirms the above findings IMPRESSION: CT head: 1. No acute intracranial abnormality identified. 2. Stable left anterolateral frontal region hyperdense extra-axial mass compatible with meningioma. 3. Stable fullness of the pituitary fossa with partial effacement of suprasellar cistern, possibly representing underlying pituitary adenoma. This can be further characterized with pituitary MRI without and with contrast on a nonemergent basis. 4. Stable chronic microvascular ischemic changes and parenchymal volume loss of the brain. CTA neck: 1. Fibrofatty plaque of left carotid bifurcation with 60% moderate proximal ICA stenosis. 2. Patent right carotid system and bilateral vertebral arteries. No dissection, aneurysm, or hemodynamically significant stenosis utilizing NASCET criteria. CTA head: Patent anterior and posterior intracranial circulation. No large vessel occlusion, aneurysm, or significant stenosis is identified. Electronically Signed   By: Kristine Garbe M.D.   On: 12/08/2018 00:12   Ct Angio Neck W And/or Wo Contrast  Result Date: 12/08/2018 CLINICAL DATA:  85 y/o M; transient left hand and face numbness with blurry vision. EXAM: CT ANGIOGRAPHY HEAD AND NECK TECHNIQUE: Multidetector CT imaging of  the head and neck was performed using the standard protocol during bolus administration of intravenous contrast. Multiplanar CT image reconstructions and MIPs were obtained to evaluate the vascular anatomy. Carotid stenosis measurements (when applicable) are obtained utilizing NASCET criteria, using the distal internal carotid diameter as the denominator. CONTRAST:  73mL OMNIPAQUE IOHEXOL 350 MG/ML SOLN COMPARISON:  03/13/2014 CT head. FINDINGS: CT HEAD FINDINGS Brain: No evidence of acute infarction, hemorrhage, hydrocephalus, extra-axial collection or herniation. Stable chronic microvascular ischemic changes and volume loss of the brain. Stable left anterolateral frontal region hyperdense extra-axial mass measuring 13 x 30  mm (AP by ML series 4, image 21) compatible with meningioma. Mild associated mass effect on the left frontal lobe with sulcal crowding. No adjacent brain edema. Stable enlargement of the pituitary gland and infundibulum with partial effacement of the suprasellar cistern. Vascular: As below. Skull: Normal. Negative for fracture or focal lesion. Sinuses: Imaged portions are clear. Orbits: No acute finding. Review of the MIP images confirms the above findings CTA NECK FINDINGS Aortic arch: Standard branching. Imaged portion shows no evidence of aneurysm or dissection. No significant stenosis of the major arch vessel origins. Aortic calcific atherosclerosis. Right carotid system: No evidence of dissection, stenosis (50% or greater) or occlusion. Left carotid system: Fibrofatty plaque of left carotid bifurcation with 60% moderate proximal ICA stenosis. Vertebral arteries: Right dominant. No evidence of dissection, stenosis (50% or greater) or occlusion. Skeleton: No acute osseous abnormality. Mild-to-moderate cervical spondylosis with multilevel disc and facet degenerative changes. No high-grade bony spinal canal stenosis. Other neck: Negative. Upper chest: Negative. Review of the MIP images confirms  the above findings CTA HEAD FINDINGS Anterior circulation: Non stenotic calcific atherosclerosis of the carotid siphons. No significant stenosis, proximal occlusion, aneurysm, or vascular malformation. Posterior circulation: Mild lower basilar stenosis. Otherwise no significant stenosis, proximal occlusion, aneurysm, or vascular malformation. Venous sinuses: As permitted by contrast timing, patent. Anatomic variants: Left dominant vertebrobasilar system. Left vertebral artery terminates in left PICA. Fetal left PCA. Large left A1 and anterior communicating artery with hypoplastic right A1, normal variant. Review of the MIP images confirms the above findings IMPRESSION: CT head: 1. No acute intracranial abnormality identified. 2. Stable left anterolateral frontal region hyperdense extra-axial mass compatible with meningioma. 3. Stable fullness of the pituitary fossa with partial effacement of suprasellar cistern, possibly representing underlying pituitary adenoma. This can be further characterized with pituitary MRI without and with contrast on a nonemergent basis. 4. Stable chronic microvascular ischemic changes and parenchymal volume loss of the brain. CTA neck: 1. Fibrofatty plaque of left carotid bifurcation with 60% moderate proximal ICA stenosis. 2. Patent right carotid system and bilateral vertebral arteries. No dissection, aneurysm, or hemodynamically significant stenosis utilizing NASCET criteria. CTA head: Patent anterior and posterior intracranial circulation. No large vessel occlusion, aneurysm, or significant stenosis is identified. Electronically Signed   By: Kristine Garbe M.D.   On: 12/08/2018 00:12   Mr Brain Wo Contrast  Result Date: 12/08/2018 CLINICAL DATA:  85 y/o M; headache, vision changes, left-sided numbness. EXAM: MRI HEAD WITHOUT CONTRAST TECHNIQUE: Multiplanar, multiecho pulse sequences of the brain and surrounding structures were obtained without intravenous contrast.  COMPARISON:  12/07/2018 CT head. FINDINGS: Brain: 6 mm focus of reduced diffusion within the right lateral thalamus (series 9, image 71) compatible with acute/early subacute infarction. No associated hemorrhage or mass effect. No extra-axial collection, hydrocephalus, intracranial hemorrhage, or herniation. Early confluent nonspecific T2 FLAIR hyperintensities in subcortical and periventricular white matter are compatible with moderate chronic microvascular ischemic changes. Moderate volume loss of the brain. The pituitary gland is enlarged measuring up to 13 mm craniocaudal. Partial effacement of suprasellar cistern. 30 x 13 mm meningioma in the left anterior frontal region (series 14, image 14). Very mild mass effect on the underlying left frontal lobe. No left frontal lobe edema. Vascular: Normal flow voids. Skull and upper cervical spine: Normal marrow signal. Sinuses/Orbits: Partial opacification of right mastoid air cells. No abnormal signal of paranasal sinuses or left mastoid air cells. Other: None. IMPRESSION: 1. 6 mm acute/early subacute infarction within right lateral thalamus. No hemorrhage or mass  effect. 2. Left anterior frontal meningioma measuring up to 30 mm with mild mass effect on the underlying left frontal lobe. No edema in the left frontal lobe. 3. Moderate chronic microvascular ischemic changes and volume loss of the brain. 4. Enlarged pituitary with partial effacement of suprasellar cistern, suspected underlying pituitary adenoma. This can be further characterized with pituitary protocol MRI with and without contrast. These results were called by telephone at the time of interpretation on 12/08/2018 at 2:40 am to Dr. Leonides Schanz, who verbally acknowledged these results. Electronically Signed   By: Kristine Garbe M.D.   On: 12/08/2018 02:42    Assessment: 85 y.o. male with acute right thalamic lacunar infarction 1. Only localizing finding on exam is sensory extinction on the left.  2.  MRI brain reveals an acute lacunar infarction within the right thalamus. Diffuse atrophy also noted.  3. CTA head/neck reveals no LVO. There is fibrofatty plaque of left carotid bifurcation with 60% moderate proximal ICA stenosis.  4. Stroke Risk Factors - HLD and HTN 5. Incidentally noted on imaging is a stable left anterolateral frontal region hyperdense extra-axial mass compatible with meningioma.   Plan: 1. HgbA1c, fasting lipid panel 2. TTE 3. PT consult, OT consult, Speech consult 4. Carotid ultrasound 5. Frequent neuro checks 6. Prophylactic therapy- Start ASA 325 mg po qd. Would change Lipitor to low dose due to patient's advanced age, but will defer to stroke team.  7. Risk factor modification 8. Telemetry monitoring 9. Modified permissive HTN protocol given advanced age: Treat if SBP > 180    @Electronically  signed: Dr. Kerney Elbe  12/08/2018, 2:51 AM

## 2018-12-09 ENCOUNTER — Encounter (HOSPITAL_COMMUNITY): Payer: Self-pay | Admitting: *Deleted

## 2018-12-09 ENCOUNTER — Other Ambulatory Visit (HOSPITAL_COMMUNITY): Payer: Medicare HMO

## 2018-12-09 ENCOUNTER — Inpatient Hospital Stay (HOSPITAL_COMMUNITY): Payer: Medicare HMO

## 2018-12-09 DIAGNOSIS — I340 Nonrheumatic mitral (valve) insufficiency: Secondary | ICD-10-CM

## 2018-12-09 LAB — NOVEL CORONAVIRUS, NAA (HOSP ORDER, SEND-OUT TO REF LAB; TAT 18-24 HRS): SARS-CoV-2, NAA: NOT DETECTED

## 2018-12-09 LAB — ECHOCARDIOGRAM COMPLETE
Height: 69 in
Weight: 2528 oz

## 2018-12-09 MED ORDER — CLOPIDOGREL BISULFATE 75 MG PO TABS: 75.0000 mg | ORAL_TABLET | Freq: Every day | ORAL | 2 refills | Status: AC

## 2018-12-09 MED ORDER — FOLIC ACID 1 MG PO TABS: 1.0000 mg | ORAL_TABLET | Freq: Every day | ORAL | Status: AC

## 2018-12-09 MED ORDER — PRAVASTATIN SODIUM 20 MG PO TABS: 20.0000 mg | ORAL_TABLET | Freq: Every day | ORAL | 0 refills | Status: AC

## 2018-12-09 MED ORDER — ASPIRIN 81 MG PO TBEC: 81.0000 mg | DELAYED_RELEASE_TABLET | Freq: Every day | ORAL | Status: AC

## 2018-12-09 MED ORDER — THIAMINE HCL 100 MG PO TABS: 100.0000 mg | ORAL_TABLET | Freq: Every day | ORAL | Status: AC

## 2018-12-09 NOTE — Consult Note (Addendum)
Chief Complaint   No chief complaint on file.   HPI   Consult requested by: Dr Zigmund Daniel Reason for consult: pituitary tumor, meningioma  HPI: Clayton Odonnell is a 85 y.o. male who presented to ED 12/07/2018 with blurred vision left eye and numbness of left hand and face. He underwent MRI brain which revealed acute lacunar infarct within the right thalamus. Neurology consulted. He was not given tpa due to completed CVA on MRI. He was admitted for obs and work up. Incidentally seen on MRI is left frontal meningioma and pituitary tumor. NSY consulted prior to discharge regarding meningioma and pituitary tumor.  Since being admitted he has noted near complete resolution in symptoms although still has mild numbness in left face. He denies facial weakness or weakness in extremities. Denies visual field deficits. He follows up with ophthalmology regularly, although is due for an eye exam.   Patient Active Problem List   Diagnosis Date Noted   Acute CVA (cerebrovascular accident) ([facility]) 12/08/2018   Meningioma ([place]) 12/08/2018   Pituitary adenoma ([facility]) 12/08/2018   Hyponatremia 12/08/2018   QT prolongation 12/08/2018   Chest pain 06/27/2015   Hyperlipidemia 06/27/2015   Upper back pain on right side 06/27/2015   Stress at home 06/27/2015   Pain in the chest    Chronic constipation 03/17/2014   Essential hypertension, benign 03/14/2014   Patient is Jehovah's Witness 03/14/2014   Other and unspecified hyperlipidemia 03/14/2014   Closed left hip fracture ([place]) 03/13/2014    PMH: Past Medical History:  Diagnosis Date   Hyperlipemia    Hypertension    Pneumonia    Transfusion of blood product refused for religious reason     PSH: Past Surgical History:  Procedure Laterality Date   FEMUR IM NAIL Left 03/14/2014   Procedure: INTRAMEDULLARY (IM) NAIL FEMORAL;  Surgeon: Renette Butters, MD;  Location: [facility];  Service: Orthopedics;  Laterality: Left;    TONSILLECTOMY      Medications Prior to Admission  Medication Sig Dispense Refill Last Dose   acetaminophen (TYLENOL) 500 MG tablet Take 1,000 mg by mouth every 8 (eight) hours as needed (pain).   Past Week at Unknown time   losartan (COZAAR) 50 MG tablet Take 50 mg by mouth daily.   12/07/2018 at Unknown time   lovastatin (MEVACOR) 10 MG tablet Take 10 mg by mouth at bedtime.   12/06/2018 at Unknown time   meloxicam (MOBIC) 15 MG tablet Take 15 mg by mouth daily as needed for pain.   Past Week at Unknown time   Multiple Vitamin (MULTIVITAMIN WITH MINERALS) TABS tablet Take 1 tablet by mouth daily.   12/07/2018 at Unknown time    SH: Social History   Tobacco Use   Smoking status: Former Smoker    Types: Cigarettes    Quit date: 03/13/1953    Years since quitting: 65.7   Smokeless tobacco: Never Used  Substance Use Topics   Alcohol use: Yes    Alcohol/week: 14.0 standard drinks    Types: 14 Cans of beer per week   Drug use: No    MEDS: Prior to Admission medications   Medication Sig Start Date End Date Taking? Authorizing Provider  acetaminophen (TYLENOL) 500 MG tablet Take 1,000 mg by mouth every 8 (eight) hours as needed (pain).   Yes [provider]  losartan (COZAAR) 50 MG tablet Take 50 mg by mouth daily.   Yes [provider]  lovastatin (MEVACOR) 10 MG tablet Take 10  mg by mouth at bedtime.   Yes [provider]  meloxicam (MOBIC) 15 MG tablet Take 15 mg by mouth daily as needed for pain.   Yes [provider]  Multiple Vitamin (MULTIVITAMIN WITH MINERALS) TABS tablet Take 1 tablet by mouth daily.   Yes [provider]  aspirin EC 81 MG EC tablet Take 1 tablet (81 mg total) by mouth daily. 12/10/18   Georgette Shell, MD  clopidogrel (PLAVIX) 75 MG tablet Take 1 tablet (75 mg total) by mouth daily. 12/10/18   Georgette Shell, MD  folic acid (FOLVITE) 1 MG tablet Take 1 tablet (1 mg total) by mouth daily. 12/10/18    Georgette Shell, MD  pravastatin (PRAVACHOL) 20 MG tablet Take 1 tablet (20 mg total) by mouth daily at 6 PM. 12/09/18   Georgette Shell, MD  thiamine 100 MG tablet Take 1 tablet (100 mg total) by mouth daily. 12/10/18   Georgette Shell, MD    ALLERGY: Allergies  Allergen Reactions   Tetanus Toxoids    Penicillins Hives and Rash    Did it involve swelling of the face/tongue/throat, SOB, or low BP? No Did it involve sudden or severe rash/hives, skin peeling, or any reaction on the inside of your mouth or nose? Yes Did you need to seek medical attention at a hospital or doctor's office? No When did it last happen?Unknown If all above answers are NO, may proceed with cephalosporin use.     Social History   Tobacco Use   Smoking status: Former Smoker    Types: Cigarettes    Quit date: 03/13/1953    Years since quitting: 65.7   Smokeless tobacco: Never Used  Substance Use Topics   Alcohol use: Yes    Alcohol/week: 14.0 standard drinks    Types: 14 Cans of beer per week     Family History  Problem Relation Age of Onset   CAD Brother    Osteoporosis Daughter      ROS   Review of Systems  Constitutional: Negative.   HENT: Negative.   Eyes: Positive for blurred vision (resolved). Negative for double vision and photophobia.  Gastrointestinal: Negative.   Genitourinary: Negative.   Musculoskeletal: Negative.   Skin: Negative.   Neurological: Positive for tingling (left face) and sensory change (left face). Negative for dizziness, tremors, speech change, focal weakness, seizures, loss of consciousness, weakness and headaches.    Exam   Vitals:   12/09/18 0821 12/09/18 1236  BP: (!) 134/57 132/70  Pulse: (!) 56 62  Resp: 18 18  Temp: 97.9 F (36.6 C) 98.2 F (36.8 C)  SpO2: 98% 96%   General appearance: WDWN, NAD Eyes: No scleral injection Cardiovascular: Regular rate and rhythm without murmurs, rubs, gallops. No edema or variciosities.  Distal pulses normal. Pulmonary: Effort normal, non-labored breathing Musculoskeletal:     Muscle tone upper extremities: Normal    Muscle tone lower extremities: Normal    Motor exam: Upper Extremities Deltoid Bicep Tricep Grip  Right 5/5 5/5 5/5 5/5  Left 5/5 5/5 5/5 5/5   Lower Extremity IP Quad PF DF EHL  Right 5/5 5/5 5/5 5/5 5/5  Left 5/5 5/5 5/5 5/5 5/5   Neurological Mental Status:    - Patient is awake, alert, oriented to person, place, month, year, and situation    - Patient is able to give a clear and coherent history.    - No signs of aphasia or neglect Cranial Nerves    -  II: Visual Fields are full. PERRL    - III/IV/VI: EOMI without ptosis or diploplia.     - V: Facial sensation is decreased on left    - VII: Facial movement is symmetric.     - VIII: hearing is intact to voice    - X: Uvula elevates symmetrically    - XI: Shoulder shrug is symmetric.    - XII: tongue is midline without atrophy or fasciculations.  Sensory: Sensation grossly intact to LT with exception of mild decrease at left fingertips Cerebellar    - FNF and HKS are intact bilaterally  Results - Imaging/Labs   Results for orders placed or performed during the hospital encounter of 12/07/18 (from the past 48 hour(s))  Comprehensive metabolic panel     Status: Abnormal   Collection Time: 12/07/18 10:16 PM  Result Value Ref Range   Sodium 130 (L) 135 - 145 mmol/L   Potassium 4.7 3.5 - 5.1 mmol/L   Chloride 97 (L) 98 - 111 mmol/L   CO2 26 22 - 32 mmol/L   Glucose, Bld 103 (H) 70 - 99 mg/dL   BUN 16 8 - 23 mg/dL   Creatinine, Ser 0.96 0.61 - 1.24 mg/dL   Calcium 9.4 8.9 - 10.3 mg/dL   Total Protein 6.5 6.5 - 8.1 g/dL   Albumin 4.0 3.5 - 5.0 g/dL   AST 30 15 - 41 U/L   ALT 18 0 - 44 U/L   Alkaline Phosphatase 35 (L) 38 - 126 U/L   Total Bilirubin 0.9 0.3 - 1.2 mg/dL   GFR calc non Af Amer >60 >60 mL/min   GFR calc Af Amer >60 >60 mL/min   Anion gap 7 5 - 15    Comment: Performed at  [facility], [address] [place], [place] [address]  CBC with Differential     Status: Abnormal   Collection Time: 12/07/18 10:16 PM  Result Value Ref Range   WBC 6.5 4.0 - 10.5 K/uL   RBC 3.95 (L) 4.22 - 5.81 MIL/uL   Hemoglobin 11.8 (L) 13.0 - 17.0 g/dL   HCT 36.0 (L) 39.0 - 52.0 %   MCV 91.1 80.0 - 100.0 fL   MCH 29.9 26.0 - 34.0 pg   MCHC 32.8 30.0 - 36.0 g/dL   RDW 12.7 11.5 - 15.5 %   Platelets 216 150 - 400 K/uL   nRBC 0.0 0.0 - 0.2 %   Neutrophils Relative % 57 %   Neutro Abs 3.7 1.7 - 7.7 K/uL   Lymphocytes Relative 27 %   Lymphs Abs 1.7 0.7 - 4.0 K/uL   Monocytes Relative 12 %   Monocytes Absolute 0.7 0.1 - 1.0 K/uL   Eosinophils Relative 3 %   Eosinophils Absolute 0.2 0.0 - 0.5 K/uL   Basophils Relative 1 %   Basophils Absolute 0.1 0.0 - 0.1 K/uL   Immature Granulocytes 0 %   Abs Immature Granulocytes 0.01 0.00 - 0.07 K/uL    Comment: Performed at [facility] [address] [place], [place] [address]  Urinalysis, Routine w reflex microscopic     Status: Abnormal   Collection Time: 12/07/18 11:31 PM  Result Value Ref Range   Color, Urine STRAW (A) YELLOW   APPearance CLEAR CLEAR   Specific Gravity, Urine 1.011 1.005 - 1.030   pH 8.0 5.0 - 8.0   Glucose, UA NEGATIVE NEGATIVE mg/dL   Hgb urine dipstick NEGATIVE NEGATIVE   Bilirubin Urine NEGATIVE NEGATIVE  Ketones, ur NEGATIVE NEGATIVE mg/dL   Protein, ur NEGATIVE NEGATIVE mg/dL   Nitrite NEGATIVE NEGATIVE   Leukocytes,Ua NEGATIVE NEGATIVE    Comment: Performed at [facility] [address], [place], [place] [address]  Novel Coronavirus,NAA,(SEND-OUT TO REF LAB - TAT 24-48 hrs); Hosp Order     Status: None   Collection Time: 12/08/18  3:03 AM   Specimen: Nasopharyngeal Swab; Respiratory  Result Value Ref Range   SARS-CoV-2, NAA NOT DETECTED NOT DETECTED    Comment: (NOTE) This test was developed and its performance characteristics determined by [employer]. This test has  not been FDA cleared or approved. This test has been authorized by FDA under an Emergency Use Authorization (EUA). This test is only authorized for the duration of time the declaration that circumstances exist justifying the authorization of the emergency use of in vitro diagnostic tests for detection of SARS-CoV-2 virus and/or diagnosis of COVID-19 infection under section 564(b)(1) of the Act, 21 U.S.C. 032ZYY-4(M)(2), unless the authorization is terminated or revoked sooner. When diagnostic testing is negative, the possibility of a false negative result should be considered in the context of a patient's recent exposures and the presence of clinical signs and symptoms consistent with COVID-19. An individual without symptoms of COVID-19 and who is not shedding SARS-CoV-2 virus would expect to have a negative (not detected) result in this assay. Performed  At: [facility] [place], [place] [id number] Rush Farmer MD QB:[id number]    Coronavirus Source NASOPHARYNGEAL     Comment: Performed at [facility], [place] [address], [place], [place] [address]  Hemoglobin A1c     Status: None   Collection Time: 12/08/18  5:03 AM  Result Value Ref Range   Hgb A1c MFr Bld 5.4 4.8 - 5.6 %    Comment: (NOTE) Pre diabetes:          5.7%-6.4% Diabetes:              >6.4% Glycemic control for   <7.0% adults with diabetes    Mean Plasma Glucose 108.28 mg/dL    Comment: Performed at [facility] [address], [place], [place] [address]  Lipid panel     Status: None   Collection Time: 12/08/18  5:03 AM  Result Value Ref Range   Cholesterol 162 0 - 200 mg/dL   Triglycerides 52 <150 mg/dL   HDL 74 >40 mg/dL   Total CHOL/HDL Ratio 2.2 RATIO   VLDL 10 0 - 40 mg/dL   LDL Cholesterol 78 0 - 99 mg/dL    Comment:        Total Cholesterol/HDL:CHD Risk Coronary Heart Disease Risk Table                     Men   Women  1/2 Average Risk   3.4   3.3  Average Risk        5.0   4.4  2 X Average Risk   9.6   7.1  3 X Average Risk  23.4   11.0        Use the calculated Patient Ratio above and the CHD Risk Table to determine the patient's CHD Risk.        ATP III CLASSIFICATION (LDL):  <100     mg/dL   Optimal  100-129  mg/dL   Near or Above  Optimal  130-159  mg/dL   Borderline  160-189  mg/dL   High  >190     mg/dL   Very High Performed at [facility] [address], [place], [place] [address]   Osmolality     Status: Abnormal   Collection Time: 12/08/18  5:03 AM  Result Value Ref Range   Osmolality 270 (L) 275 - 295 mOsm/kg    Comment: Performed at [facility], [place] [address] [place], [place] [address]  Basic metabolic panel     Status: Abnormal   Collection Time: 12/08/18  5:03 AM  Result Value Ref Range   Sodium 130 (L) 135 - 145 mmol/L   Potassium 3.9 3.5 - 5.1 mmol/L   Chloride 95 (L) 98 - 111 mmol/L   CO2 27 22 - 32 mmol/L   Glucose, Bld 88 70 - 99 mg/dL   BUN 10 8 - 23 mg/dL   Creatinine, Ser 0.87 0.61 - 1.24 mg/dL   Calcium 8.9 8.9 - 10.3 mg/dL   GFR calc non Af Amer >60 >60 mL/min   GFR calc Af Amer >60 >60 mL/min   Anion gap 8 5 - 15    Comment: Performed at [facility], [place] [address] [place], [place] [address]    Ct Angio Head W Or Wo Contrast  Result Date: 12/08/2018 CLINICAL DATA:  85 y/o M; transient left hand and face numbness with blurry vision. EXAM: CT ANGIOGRAPHY HEAD AND NECK TECHNIQUE: Multidetector CT imaging of the head and neck was performed using the standard protocol during bolus administration of intravenous contrast. Multiplanar CT image reconstructions and MIPs were obtained to evaluate the vascular anatomy. Carotid stenosis measurements (when applicable) are obtained utilizing NASCET criteria, using the distal internal carotid diameter as the denominator. CONTRAST:  77mL OMNIPAQUE IOHEXOL 350 MG/ML SOLN COMPARISON:  03/13/2014 CT head. FINDINGS: CT HEAD FINDINGS Brain:  No evidence of acute infarction, hemorrhage, hydrocephalus, extra-axial collection or herniation. Stable chronic microvascular ischemic changes and volume loss of the brain. Stable left anterolateral frontal region hyperdense extra-axial mass measuring 13 x 30 mm (AP by ML series 4, image 21) compatible with meningioma. Mild associated mass effect on the left frontal lobe with sulcal crowding. No adjacent brain edema. Stable enlargement of the pituitary gland and infundibulum with partial effacement of the suprasellar cistern. Vascular: As below. Skull: Normal. Negative for fracture or focal lesion. Sinuses: Imaged portions are clear. Orbits: No acute finding. Review of the MIP images confirms the above findings CTA NECK FINDINGS Aortic arch: Standard branching. Imaged portion shows no evidence of aneurysm or dissection. No significant stenosis of the major arch vessel origins. Aortic calcific atherosclerosis. Right carotid system: No evidence of dissection, stenosis (50% or greater) or occlusion. Left carotid system: Fibrofatty plaque of left carotid bifurcation with 60% moderate proximal ICA stenosis. Vertebral arteries: Right dominant. No evidence of dissection, stenosis (50% or greater) or occlusion. Skeleton: No acute osseous abnormality. Mild-to-moderate cervical spondylosis with multilevel disc and facet degenerative changes. No high-grade bony spinal canal stenosis. Other neck: Negative. Upper chest: Negative. Review of the MIP images confirms the above findings CTA HEAD FINDINGS Anterior circulation: Non stenotic calcific atherosclerosis of the carotid siphons. No significant stenosis, proximal occlusion, aneurysm, or vascular malformation. Posterior circulation: Mild lower basilar stenosis. Otherwise no significant stenosis, proximal occlusion, aneurysm, or vascular malformation. Venous sinuses: As permitted by contrast timing, patent. Anatomic variants: Left dominant vertebrobasilar system. Left vertebral  artery terminates in left PICA.  Fetal left PCA. Large left A1 and anterior communicating artery with hypoplastic right A1, normal variant. Review of the MIP images confirms the above findings IMPRESSION: CT head: 1. No acute intracranial abnormality identified. 2. Stable left anterolateral frontal region hyperdense extra-axial mass compatible with meningioma. 3. Stable fullness of the pituitary fossa with partial effacement of suprasellar cistern, possibly representing underlying pituitary adenoma. This can be further characterized with pituitary MRI without and with contrast on a nonemergent basis. 4. Stable chronic microvascular ischemic changes and parenchymal volume loss of the brain. CTA neck: 1. Fibrofatty plaque of left carotid bifurcation with 60% moderate proximal ICA stenosis. 2. Patent right carotid system and bilateral vertebral arteries. No dissection, aneurysm, or hemodynamically significant stenosis utilizing NASCET criteria. CTA head: Patent anterior and posterior intracranial circulation. No large vessel occlusion, aneurysm, or significant stenosis is identified. Electronically Signed   By: Kristine Garbe M.D.   On: 12/08/2018 00:12   Ct Angio Neck W And/or Wo Contrast  Result Date: 12/08/2018 CLINICAL DATA:  85 y/o M; transient left hand and face numbness with blurry vision. EXAM: CT ANGIOGRAPHY HEAD AND NECK TECHNIQUE: Multidetector CT imaging of the head and neck was performed using the standard protocol during bolus administration of intravenous contrast. Multiplanar CT image reconstructions and MIPs were obtained to evaluate the vascular anatomy. Carotid stenosis measurements (when applicable) are obtained utilizing NASCET criteria, using the distal internal carotid diameter as the denominator. CONTRAST:  17mL OMNIPAQUE IOHEXOL 350 MG/ML SOLN COMPARISON:  03/13/2014 CT head. FINDINGS: CT HEAD FINDINGS Brain: No evidence of acute infarction, hemorrhage, hydrocephalus, extra-axial  collection or herniation. Stable chronic microvascular ischemic changes and volume loss of the brain. Stable left anterolateral frontal region hyperdense extra-axial mass measuring 13 x 30 mm (AP by ML series 4, image 21) compatible with meningioma. Mild associated mass effect on the left frontal lobe with sulcal crowding. No adjacent brain edema. Stable enlargement of the pituitary gland and infundibulum with partial effacement of the suprasellar cistern. Vascular: As below. Skull: Normal. Negative for fracture or focal lesion. Sinuses: Imaged portions are clear. Orbits: No acute finding. Review of the MIP images confirms the above findings CTA NECK FINDINGS Aortic arch: Standard branching. Imaged portion shows no evidence of aneurysm or dissection. No significant stenosis of the major arch vessel origins. Aortic calcific atherosclerosis. Right carotid system: No evidence of dissection, stenosis (50% or greater) or occlusion. Left carotid system: Fibrofatty plaque of left carotid bifurcation with 60% moderate proximal ICA stenosis. Vertebral arteries: Right dominant. No evidence of dissection, stenosis (50% or greater) or occlusion. Skeleton: No acute osseous abnormality. Mild-to-moderate cervical spondylosis with multilevel disc and facet degenerative changes. No high-grade bony spinal canal stenosis. Other neck: Negative. Upper chest: Negative. Review of the MIP images confirms the above findings CTA HEAD FINDINGS Anterior circulation: Non stenotic calcific atherosclerosis of the carotid siphons. No significant stenosis, proximal occlusion, aneurysm, or vascular malformation. Posterior circulation: Mild lower basilar stenosis. Otherwise no significant stenosis, proximal occlusion, aneurysm, or vascular malformation. Venous sinuses: As permitted by contrast timing, patent. Anatomic variants: Left dominant vertebrobasilar system. Left vertebral artery terminates in left PICA. Fetal left PCA. Large left A1 and  anterior communicating artery with hypoplastic right A1, normal variant. Review of the MIP images confirms the above findings IMPRESSION: CT head: 1. No acute intracranial abnormality identified. 2. Stable left anterolateral frontal region hyperdense extra-axial mass compatible with meningioma. 3. Stable fullness of the pituitary fossa with partial effacement of suprasellar cistern, possibly representing underlying pituitary adenoma.  This can be further characterized with pituitary MRI without and with contrast on a nonemergent basis. 4. Stable chronic microvascular ischemic changes and parenchymal volume loss of the brain. CTA neck: 1. Fibrofatty plaque of left carotid bifurcation with 60% moderate proximal ICA stenosis. 2. Patent right carotid system and bilateral vertebral arteries. No dissection, aneurysm, or hemodynamically significant stenosis utilizing NASCET criteria. CTA head: Patent anterior and posterior intracranial circulation. No large vessel occlusion, aneurysm, or significant stenosis is identified. Electronically Signed   By: Kristine Garbe M.D.   On: 12/08/2018 00:12   Mr Brain Wo Contrast  Result Date: 12/08/2018 CLINICAL DATA:  85 y/o M; headache, vision changes, left-sided numbness. EXAM: MRI HEAD WITHOUT CONTRAST TECHNIQUE: Multiplanar, multiecho pulse sequences of the brain and surrounding structures were obtained without intravenous contrast. COMPARISON:  12/07/2018 CT head. FINDINGS: Brain: 6 mm focus of reduced diffusion within the right lateral thalamus (series 9, image 71) compatible with acute/early subacute infarction. No associated hemorrhage or mass effect. No extra-axial collection, hydrocephalus, intracranial hemorrhage, or herniation. Early confluent nonspecific T2 FLAIR hyperintensities in subcortical and periventricular white matter are compatible with moderate chronic microvascular ischemic changes. Moderate volume loss of the brain. The pituitary gland is enlarged  measuring up to 13 mm craniocaudal. Partial effacement of suprasellar cistern. 30 x 13 mm meningioma in the left anterior frontal region (series 14, image 14). Very mild mass effect on the underlying left frontal lobe. No left frontal lobe edema. Vascular: Normal flow voids. Skull and upper cervical spine: Normal marrow signal. Sinuses/Orbits: Partial opacification of right mastoid air cells. No abnormal signal of paranasal sinuses or left mastoid air cells. Other: None. IMPRESSION: 1. 6 mm acute/early subacute infarction within right lateral thalamus. No hemorrhage or mass effect. 2. Left anterior frontal meningioma measuring up to 30 mm with mild mass effect on the underlying left frontal lobe. No edema in the left frontal lobe. 3. Moderate chronic microvascular ischemic changes and volume loss of the brain. 4. Enlarged pituitary with partial effacement of suprasellar cistern, suspected underlying pituitary adenoma. This can be further characterized with pituitary protocol MRI with and without contrast. These results were called by telephone at the time of interpretation on 12/08/2018 at 2:40 am to Dr. Leonides Schanz, who verbally acknowledged these results. Electronically Signed   By: Kristine Garbe M.D.   On: 12/08/2018 02:42   Impression/Plan   85 y.o. male admitted after acute right thalamic infarct. Incidentally seen on MRI are left frontal meningioma and pituitary tumor. He is neurologically intact with exception of mild left decreased facial sensation and left fingertips.  Left frontal meningioma - Outpatient follow up  Pituitary turmor - Visual fields grossly normal, although will need formal eye exam.   - Outpatient follow up for MRI with pituitary protocol.   Thalamic infarct - per neuro  Please call for any concerns.   Ferne Reus, PA-C [facility] [employer]

## 2018-12-09 NOTE — Discharge Instructions (Signed)
Continue aspirin and Plavix together daily for 3 months then continue only aspirin.

## 2018-12-09 NOTE — Progress Notes (Signed)
Occupational Therapy Treatment Patient Details Name: Clayton Odonnell MRN: [id number] DOB: December 14, 1930 Today's Date: 12/09/2018    History of present illness Pt is an 85 y/o male with a PMH significant for HTN, L femur IM nail 2015. He presents to the ED via EMS with vision changes and L-side sensory changes in face, hand, and lower leg/foot. 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   OT comments  Pt performing LUE fine motor coordination and ability to perform opening containers with modified independence. Fingertips in LUE continue to be numb, but pt reports improvement since yesterday. Fine motor coordination handout provided. Pt performing grooming at sink with fair balance and stood x5 mins. ADL improved to modified independence. Pt at his functional baseline. No OT follow-up required. OT signing off.    Follow Up Recommendations  No OT follow up;Supervision - Intermittent    Equipment Recommendations  None recommended by OT    Recommendations for Other Services      Precautions / Restrictions Precautions Precautions: Fall Restrictions Weight Bearing Restrictions: No       Mobility Bed Mobility Overal bed mobility: Modified Independent Bed Mobility: Supine to Sit     Supine to sit: Supervision     General bed mobility comments: No assist required.  Transfers Overall transfer level: Modified independent Equipment used: None                  Balance Overall balance assessment: Modified Independent                                         ADL either performed or assessed with clinical judgement   ADL Overall ADL's : At baseline                                       General ADL Comments: Pt is R hand dominant and mostly independent with ADL, LUE continues to be able  to stabilize phone, assist with sock donning and tell difference in temperature.     Vision   Vision Assessment?: No apparent visual deficits   Perception     Praxis      Cognition Arousal/Alertness: Awake/alert Behavior During Therapy: WFL for tasks assessed/performed Overall Cognitive Status: Within Functional Limits for tasks assessed                                          Exercises Exercises: Other exercises Other Exercises Other Exercises: fine motor coordination   Shoulder Instructions       General Comments      Pertinent Vitals/ Pain       Pain Assessment: No/denies pain  Home Living Family/patient expects to be discharged to:: Private residence Living Arrangements: Alone                                      Prior Functioning/Environment              Frequency  Min 2X/week  Progress Toward Goals  OT Goals(current goals can now be found in the care plan section)  Progress towards OT goals: Progressing toward goals  Acute Rehab OT Goals Patient Stated Goal: to go home OT Goal Formulation: With patient Time For Goal Achievement: 12/22/18 Potential to Achieve Goals: Good ADL Goals Additional ADL Goal #1: Pt will tolerate LUE HEP with independence with handout provided to increase to good fine motor coordination.  Plan Discharge plan remains appropriate    Co-evaluation                 AM-PAC OT "6 Clicks" Daily Activity     Outcome Measure   Help from another person eating meals?: None Help from another person taking care of personal grooming?: None Help from another person toileting, which includes using toliet, bedpan, or urinal?: None Help from another person bathing (including washing, rinsing, drying)?: None Help from another person to put on and taking off regular upper body clothing?: None Help from another person to put on and taking off regular lower body clothing?: None 6 Click  Score: 24    End of Session Equipment Utilized During Treatment: Gait belt  OT Visit Diagnosis: Unsteadiness on feet (R26.81);Muscle weakness (generalized) (M62.81)   Activity Tolerance Patient tolerated treatment well   Patient Left in chair;with call bell/phone within reach   Nurse Communication Mobility status        Time: 0601-5615 OT Time Calculation (min): 27 min  Charges: OT General Charges $OT Visit: 1 Visit OT Treatments $Self Care/Home Management : 8-22 mins $Neuromuscular Re-education: 8-22 mins  Darryl Nestle) Marsa Aris OTR/L Acute Rehabilitation Services Pager: [phone] Office: [phone]    Audie Pinto 12/09/2018, 1:52 PM

## 2018-12-09 NOTE — Discharge Summary (Signed)
Physician Discharge Summary  Clayton Odonnell ZOX:[id number] DOB: 1931/02/25 DOA: 12/07/2018  PCP: Leanna Battles, MD  Admit date: 12/07/2018 Discharge date: 12/09/2018 Admitted from home  discharged to home Recommendations for Outpatient Follow-up:  1. Follow up with PCP in 1-2 weeks 2. Please obtain BMP/CBC in one week 3. Please follow up with [facility] neurology 4. Follow-up with neurosurgery for the pituitary mass 5. Follow-up with vascular surgery for carotid artery plaque  Home Health yes Equipment/Devices none Discharge Condition stable and improved CODE STATUS DNR  diet recommendation: Cardiac Brief/Interim Summary:85 y.o. male with medical history significant of hypertension, hyperlipidemia presenting to the hospital via EMS for evaluation of vision changes and left-sided numbness.  Patient states around 7:30 PM yesterday evening he experienced acute onset blurry vision in his left eye and the left side of his face and left hand became numb.  Denies any weakness in his upper or lower extremities.  Denies any difficulty with speech.  Reports having a headache.  Denies history of prior stroke.  States he smoked cigarettes during his teenage years and is not a current smoker.  States the vision in his left eye has now reapproved and is back to baseline.  Continues to experience slight numbness on the left side of his face and his left hand.  Denies any fevers, chills, chest pain, cough, shortness of breath, nausea, vomiting, abdominal pain, diarrhea, or dysuria.  No other complaints.  ED Course: Vitals stable on arrival.  No leukocytosis.  Hemoglobin 11.8, baseline in the 12 range.  Sodium 130.  Blood glucose 103.  UA not suggestive of infection.  COVID-19 rapid test pending.  CT head showing stable meningioma, stable fullness of the pituitary fossa thought to represent underlying pituitary adenoma, and no acute intracranial abnormality.  CT angiogram head negative for large vessel  occlusion.  CT angiogram neck showing 60% moderate proximal ICA stenosis.  Brain MRI showing a 6 mm acute/early subacute infarction within the right lateral thalamus.  No hemorrhage or mass-effect.  Left anterior frontal meningioma measuring up to 30 mm with mild mass-effect on the underlying left frontal lobe.  No edema in the left frontal lobe.  Enlarged pituitary with partial effacement of suprasellar cistern, suspected underlying pituitary adenoma. Received aspirin 324 mg in the ED. Neurology consulted.  Discharge Diagnoses:  Principal Problem:   Acute CVA (cerebrovascular accident) [facility]) Active Problems:   Meningioma ([place])   Pituitary adenoma ([place])   Hyponatremia   QT prolongation  Acute CVA Presenting with complaints of left eye blurry vision and numbness of the left side of the face and left hand.  Brain MRI showing a 6 mm acute/early subacute infarction within the right lateral thalamus.  No hemorrhage or mass-effect.  CT angiogram neck showing 60% moderate proximal ICA stenosis.  CT angiogram head negative for large vessel occlusion.  Patient was admitted to the telemetry floor seen by neurology in consult.  Work-up shows hemoglobin A1c is 5.4 LDL 78.  Patient was seen by PT OT and speech.  PT and OT recommends home PT.  He passed bedside swallow evaluation.  Echocardiogram done today.  He will be discharged home today he will follow-up with neurologist vascular surgeon and neurosurgery for recent stroke moderate proximal ICA stenosis, and suspected pituitary adenoma/meningioma.  Echocardiogram shows normal ejection fraction.  Meningioma MRI showing left anterior frontal meningioma measuring up to 30 mm with mild mass-effect on the underlying left frontal lobe.  No edema in the left frontal lobe.  His current  neurologic deficits can be explained by the acute/subacute right lateral thalamic infarct.  He will follow-up with neurosurgery as an outpatient.  Suspected pituitary adenoma MRI  showing enlarged pituitary with partial effacement of suprasellar cistern, suspected underlying pituitary adenoma. -Patient will need a pituitary MRI for further evaluation in few weeks.  Mild hyponatremia improved probably related to alcohol use counseled patient   Alcohol use Patient reports drinking beer 5 times a week.  -Thiamine, folate, multivitamin   Estimated body mass index is 23.33 kg/m as calculated from the following:   Height as of this encounter: 5\' 9"  (1.753 m).   Weight as of this encounter: 71.7 kg.  Discharge Instructions  Discharge Instructions    Ambulatory referral to Neurology   Complete by: As directed    Follow up with stroke clinic NP (Jessica Vanschaick or Cecille Rubin, if both not available, consider Zachery Dauer, or A[place]) at [facility] in about 4 weeks. Thanks.   Ambulatory referral to Neurosurgery   Complete by: As directed    Fast-growing meningioma, pituitary adenoma   Ambulatory referral to Vascular Surgery   Complete by: As directed    Left ICA stenosis with soft plaque     Allergies as of 12/09/2018      Reactions   Tetanus Toxoids    Penicillins Hives, Rash   Did it involve swelling of the face/tongue/throat, SOB, or low BP? No Did it involve sudden or severe rash/hives, skin peeling, or any reaction on the inside of your mouth or nose? Yes Did you need to seek medical attention at a hospital or doctor's office? No When did it last happen?Unknown If all above answers are "NO", may proceed with cephalosporin use.      Medication List    STOP taking these medications   lovastatin 10 MG tablet Commonly known as: MEVACOR Replaced by: pravastatin 20 MG tablet   meloxicam 15 MG tablet Commonly known as: MOBIC     TAKE these medications   acetaminophen 500 MG tablet Commonly known as: TYLENOL Take 1,000 mg by mouth every 8 (eight) hours as needed (pain).   aspirin 81 MG EC tablet Take 1 tablet (81 mg total) by mouth daily. Start  taking on: December 10, 2018   clopidogrel 75 MG tablet Commonly known as: PLAVIX Take 1 tablet (75 mg total) by mouth daily. Start taking on: December 10, 1935   folic acid 1 MG tablet Commonly known as: FOLVITE Take 1 tablet (1 mg total) by mouth daily. Start taking on: December 10, 2018   losartan 50 MG tablet Commonly known as: COZAAR Take 50 mg by mouth daily.   multivitamin with minerals Tabs tablet Take 1 tablet by mouth daily.   pravastatin 20 MG tablet Commonly known as: PRAVACHOL Take 1 tablet (20 mg total) by mouth daily at 6 PM. Replaces: lovastatin 10 MG tablet   thiamine 100 MG tablet Take 1 tablet (100 mg total) by mouth daily. Start taking on: December 10, 2018      Follow-up Information    Leanna Battles, MD.   Specialty: Internal Medicine Contact information: [address] [place] [address] [phone]        [facility]. Schedule an appointment as soon as possible for a visit in 4 week(s).   Specialty: Neurology Contact information: [address] [place] [phone]       Rosetta Posner, MD Follow up.   Specialties: Vascular Surgery, Cardiology Why: left carotid plaque  Contact information: [address] [place] [address] [phone]        [place] .        Consuella Lose, MD Follow up.   Specialty: Neurosurgery Contact information: [address] [place] [address] [phone]          Allergies  Allergen Reactions  . Tetanus Toxoids   . Penicillins Hives and Rash    Did it involve swelling of the face/tongue/throat, SOB, or low BP? No Did it involve sudden or severe rash/hives, skin peeling, or any reaction on the inside of your mouth or nose? Yes Did you need to seek medical attention at a hospital or doctor's office? No When did it last happen?Unknown If all above answers are "NO", may proceed with cephalosporin use.      Consultations: Neurology  Procedures/Studies: Ct Angio Head W Or Wo Contrast  Result Date: 12/08/2018 CLINICAL DATA:  85 y/o M; transient left hand and face numbness with blurry vision. EXAM: CT ANGIOGRAPHY HEAD AND NECK TECHNIQUE: Multidetector CT imaging of the head and neck was performed using the standard protocol during bolus administration of intravenous contrast. Multiplanar CT image reconstructions and MIPs were obtained to evaluate the vascular anatomy. Carotid stenosis measurements (when applicable) are obtained utilizing NASCET criteria, using the distal internal carotid diameter as the denominator. CONTRAST:  36mL OMNIPAQUE IOHEXOL 350 MG/ML SOLN COMPARISON:  03/13/2014 CT head. FINDINGS: CT HEAD FINDINGS Brain: No evidence of acute infarction, hemorrhage, hydrocephalus, extra-axial collection or herniation. Stable chronic microvascular ischemic changes and volume loss of the brain. Stable left anterolateral frontal region hyperdense extra-axial mass measuring 13 x 30 mm (AP by ML series 4, image 21) compatible with meningioma. Mild associated mass effect on the left frontal lobe with sulcal crowding. No adjacent brain edema. Stable enlargement of the pituitary gland and infundibulum with partial effacement of the suprasellar cistern. Vascular: As below. Skull: Normal. Negative for fracture or focal lesion. Sinuses: Imaged portions are clear. Orbits: No acute finding. Review of the MIP images confirms the above findings CTA NECK FINDINGS Aortic arch: Standard branching. Imaged portion shows no evidence of aneurysm or dissection. No significant stenosis of the major arch vessel origins. Aortic calcific atherosclerosis. Right carotid system: No evidence of dissection, stenosis (50% or greater) or occlusion. Left carotid system: Fibrofatty plaque of left carotid bifurcation with 60% moderate proximal ICA stenosis. Vertebral arteries: Right dominant. No evidence of dissection, stenosis (50% or  greater) or occlusion. Skeleton: No acute osseous abnormality. Mild-to-moderate cervical spondylosis with multilevel disc and facet degenerative changes. No high-grade bony spinal canal stenosis. Other neck: Negative. Upper chest: Negative. Review of the MIP images confirms the above findings CTA HEAD FINDINGS Anterior circulation: Non stenotic calcific atherosclerosis of the carotid siphons. No significant stenosis, proximal occlusion, aneurysm, or vascular malformation. Posterior circulation: Mild lower basilar stenosis. Otherwise no significant stenosis, proximal occlusion, aneurysm, or vascular malformation. Venous sinuses: As permitted by contrast timing, patent. Anatomic variants: Left dominant vertebrobasilar system. Left vertebral artery terminates in left PICA. Fetal left PCA. Large left A1 and anterior communicating artery with hypoplastic right A1, normal variant. Review of the MIP images confirms the above findings IMPRESSION: CT head: 1. No acute intracranial abnormality identified. 2. Stable left anterolateral frontal region hyperdense extra-axial mass compatible with meningioma. 3. Stable fullness of the pituitary fossa with partial effacement of suprasellar cistern, possibly representing underlying pituitary adenoma. This can be further characterized with pituitary MRI without and with contrast on a nonemergent basis. 4.  Stable chronic microvascular ischemic changes and parenchymal volume loss of the brain. CTA neck: 1. Fibrofatty plaque of left carotid bifurcation with 60% moderate proximal ICA stenosis. 2. Patent right carotid system and bilateral vertebral arteries. No dissection, aneurysm, or hemodynamically significant stenosis utilizing NASCET criteria. CTA head: Patent anterior and posterior intracranial circulation. No large vessel occlusion, aneurysm, or significant stenosis is identified. Electronically Signed   By: Kristine Garbe M.D.   On: 12/08/2018 00:12   Ct Angio Neck W  And/or Wo Contrast  Result Date: 12/08/2018 CLINICAL DATA:  85 y/o M; transient left hand and face numbness with blurry vision. EXAM: CT ANGIOGRAPHY HEAD AND NECK TECHNIQUE: Multidetector CT imaging of the head and neck was performed using the standard protocol during bolus administration of intravenous contrast. Multiplanar CT image reconstructions and MIPs were obtained to evaluate the vascular anatomy. Carotid stenosis measurements (when applicable) are obtained utilizing NASCET criteria, using the distal internal carotid diameter as the denominator. CONTRAST:  95mL OMNIPAQUE IOHEXOL 350 MG/ML SOLN COMPARISON:  03/13/2014 CT head. FINDINGS: CT HEAD FINDINGS Brain: No evidence of acute infarction, hemorrhage, hydrocephalus, extra-axial collection or herniation. Stable chronic microvascular ischemic changes and volume loss of the brain. Stable left anterolateral frontal region hyperdense extra-axial mass measuring 13 x 30 mm (AP by ML series 4, image 21) compatible with meningioma. Mild associated mass effect on the left frontal lobe with sulcal crowding. No adjacent brain edema. Stable enlargement of the pituitary gland and infundibulum with partial effacement of the suprasellar cistern. Vascular: As below. Skull: Normal. Negative for fracture or focal lesion. Sinuses: Imaged portions are clear. Orbits: No acute finding. Review of the MIP images confirms the above findings CTA NECK FINDINGS Aortic arch: Standard branching. Imaged portion shows no evidence of aneurysm or dissection. No significant stenosis of the major arch vessel origins. Aortic calcific atherosclerosis. Right carotid system: No evidence of dissection, stenosis (50% or greater) or occlusion. Left carotid system: Fibrofatty plaque of left carotid bifurcation with 60% moderate proximal ICA stenosis. Vertebral arteries: Right dominant. No evidence of dissection, stenosis (50% or greater) or occlusion. Skeleton: No acute osseous abnormality.  Mild-to-moderate cervical spondylosis with multilevel disc and facet degenerative changes. No high-grade bony spinal canal stenosis. Other neck: Negative. Upper chest: Negative. Review of the MIP images confirms the above findings CTA HEAD FINDINGS Anterior circulation: Non stenotic calcific atherosclerosis of the carotid siphons. No significant stenosis, proximal occlusion, aneurysm, or vascular malformation. Posterior circulation: Mild lower basilar stenosis. Otherwise no significant stenosis, proximal occlusion, aneurysm, or vascular malformation. Venous sinuses: As permitted by contrast timing, patent. Anatomic variants: Left dominant vertebrobasilar system. Left vertebral artery terminates in left PICA. Fetal left PCA. Large left A1 and anterior communicating artery with hypoplastic right A1, normal variant. Review of the MIP images confirms the above findings IMPRESSION: CT head: 1. No acute intracranial abnormality identified. 2. Stable left anterolateral frontal region hyperdense extra-axial mass compatible with meningioma. 3. Stable fullness of the pituitary fossa with partial effacement of suprasellar cistern, possibly representing underlying pituitary adenoma. This can be further characterized with pituitary MRI without and with contrast on a nonemergent basis. 4. Stable chronic microvascular ischemic changes and parenchymal volume loss of the brain. CTA neck: 1. Fibrofatty plaque of left carotid bifurcation with 60% moderate proximal ICA stenosis. 2. Patent right carotid system and bilateral vertebral arteries. No dissection, aneurysm, or hemodynamically significant stenosis utilizing NASCET criteria. CTA head: Patent anterior and posterior intracranial circulation. No large vessel occlusion, aneurysm, or significant stenosis is identified.  Electronically Signed   By: Kristine Garbe M.D.   On: 12/08/2018 00:12   Mr Brain Wo Contrast  Result Date: 12/08/2018 CLINICAL DATA:  85 y/o M;  headache, vision changes, left-sided numbness. EXAM: MRI HEAD WITHOUT CONTRAST TECHNIQUE: Multiplanar, multiecho pulse sequences of the brain and surrounding structures were obtained without intravenous contrast. COMPARISON:  12/07/2018 CT head. FINDINGS: Brain: 6 mm focus of reduced diffusion within the right lateral thalamus (series 9, image 71) compatible with acute/early subacute infarction. No associated hemorrhage or mass effect. No extra-axial collection, hydrocephalus, intracranial hemorrhage, or herniation. Early confluent nonspecific T2 FLAIR hyperintensities in subcortical and periventricular white matter are compatible with moderate chronic microvascular ischemic changes. Moderate volume loss of the brain. The pituitary gland is enlarged measuring up to 13 mm craniocaudal. Partial effacement of suprasellar cistern. 30 x 13 mm meningioma in the left anterior frontal region (series 14, image 14). Very mild mass effect on the underlying left frontal lobe. No left frontal lobe edema. Vascular: Normal flow voids. Skull and upper cervical spine: Normal marrow signal. Sinuses/Orbits: Partial opacification of right mastoid air cells. No abnormal signal of paranasal sinuses or left mastoid air cells. Other: None. IMPRESSION: 1. 6 mm acute/early subacute infarction within right lateral thalamus. No hemorrhage or mass effect. 2. Left anterior frontal meningioma measuring up to 30 mm with mild mass effect on the underlying left frontal lobe. No edema in the left frontal lobe. 3. Moderate chronic microvascular ischemic changes and volume loss of the brain. 4. Enlarged pituitary with partial effacement of suprasellar cistern, suspected underlying pituitary adenoma. This can be further characterized with pituitary protocol MRI with and without contrast. These results were called by telephone at the time of interpretation on 12/08/2018 at 2:40 am to Dr. Leonides Schanz, who verbally acknowledged these results. Electronically Signed    By: Kristine Garbe M.D.   On: 12/08/2018 02:42    (Echo, Carotid, EGD, Colonoscopy, ERCP)    Subjective: Resting in bed feels better anxious to go home continues to have some numbness on the left side of the face  Discharge Exam: Vitals:   12/09/18 0821 12/09/18 1236  BP: (!) 134/57 132/70  Pulse: (!) 56 62  Resp: 18 18  Temp: 97.9 F (36.6 C) 98.2 F (36.8 C)  SpO2: 98% 96%   Vitals:   12/08/18 2316 12/09/18 0324 12/09/18 0821 12/09/18 1236  BP: (!) 156/69 (!) 143/61 (!) 134/57 132/70  Pulse: (!) 55 (!) 59 (!) 56 62  Resp: 18 18 18 18   Temp: 98.6 F (37 C) 98.5 F (36.9 C) 97.9 F (36.6 C) 98.2 F (36.8 C)  TempSrc: Oral Oral Oral Tympanic  SpO2: 97% 98% 98% 96%  Weight:      Height:        General: Pt is alert, awake, not in acute distress Cardiovascular: RRR, S1/S2 +, no rubs, no gallops Respiratory: CTA bilaterally, no wheezing, no rhonchi Abdominal: Soft, NT, ND, bowel sounds + Extremities: no edema, no cyanosis    The results of significant diagnostics from this hospitalization (including imaging, microbiology, ancillary and laboratory) are listed below for reference.     Microbiology: Recent Results (from the past 240 hour(s))  Novel Coronavirus,NAA,(SEND-OUT TO REF LAB - TAT 24-48 hrs); Hosp Order     Status: None   Collection Time: 12/08/18  3:03 AM   Specimen: Nasopharyngeal Swab; Respiratory  Result Value Ref Range Status   SARS-CoV-2, NAA NOT DETECTED NOT DETECTED Final    Comment: (NOTE) This test  was developed and its performance characteristics determined by [employer]. This test has not been FDA cleared or approved. This test has been authorized by FDA under an Emergency Use Authorization (EUA). This test is only authorized for the duration of time the declaration that circumstances exist justifying the authorization of the emergency use of in vitro diagnostic tests for detection of SARS-CoV-2 virus and/or  diagnosis of COVID-19 infection under section 564(b)(1) of the Act, 21 U.S.C. 601UXN-2(T)(5), unless the authorization is terminated or revoked sooner. When diagnostic testing is negative, the possibility of a false negative result should be considered in the context of a patient's recent exposures and the presence of clinical signs and symptoms consistent with COVID-19. An individual without symptoms of COVID-19 and who is not shedding SARS-CoV-2 virus would expect to have a negative (not detected) result in this assay. Performed  At: [facility] [address] [place], [place] [id number] Rush Farmer MD YH:[id number]    [place]  Final    Comment: Performed at [facility], [place] [address] [place], [place] [address]     Labs: BNP (last 3 results) No results for input(s): BNP in the last 8760 hours. Basic Metabolic Panel: Recent Labs  Lab 12/07/18 2216 12/08/18 0503  NA 130* 130*  K 4.7 3.9  CL 97* 95*  CO2 26 27  GLUCOSE 103* 88  BUN 16 10  CREATININE 0.96 0.87  CALCIUM 9.4 8.9   Liver Function Tests: Recent Labs  Lab 12/07/18 2216  AST 30  ALT 18  ALKPHOS 35*  BILITOT 0.9  PROT 6.5  ALBUMIN 4.0   No results for input(s): LIPASE, AMYLASE in the last 168 hours. No results for input(s): AMMONIA in the last 168 hours. CBC: Recent Labs  Lab 12/07/18 2216  WBC 6.5  NEUTROABS 3.7  HGB 11.8*  HCT 36.0*  MCV 91.1  PLT 216   Cardiac Enzymes: No results for input(s): CKTOTAL, CKMB, CKMBINDEX, TROPONINI in the last 168 hours. BNP: Invalid input(s): POCBNP CBG: No results for input(s): GLUCAP in the last 168 hours. D-Dimer No results for input(s): DDIMER in the last 72 hours. Hgb A1c Recent Labs    12/08/18 0503  HGBA1C 5.4   Lipid Profile Recent Labs    12/08/18 0503  CHOL 162  HDL 74  LDLCALC 78  TRIG 52  CHOLHDL 2.2   Thyroid function studies No results for input(s): TSH, T4TOTAL, T3FREE, THYROIDAB  in the last 72 hours.  Invalid input(s): FREET3 Anemia work up No results for input(s): VITAMINB12, FOLATE, FERRITIN, TIBC, IRON, RETICCTPCT in the last 72 hours. Urinalysis    Component Value Date/Time   COLORURINE STRAW (A) 12/07/2018 2331   APPEARANCEUR CLEAR 12/07/2018 2331   LABSPEC 1.011 12/07/2018 2331   PHURINE 8.0 12/07/2018 2331   GLUCOSEU NEGATIVE 12/07/2018 2331   HGBUR NEGATIVE 12/07/2018 2331   BILIRUBINUR NEGATIVE 12/07/2018 2331   KETONESUR NEGATIVE 12/07/2018 2331   PROTEINUR NEGATIVE 12/07/2018 2331   UROBILINOGEN 0.2 03/14/2014 0459   NITRITE NEGATIVE 12/07/2018 2331   LEUKOCYTESUR NEGATIVE 12/07/2018 2331   Sepsis Labs Invalid input(s): PROCALCITONIN,  WBC,  LACTICIDVEN Microbiology Recent Results (from the past 240 hour(s))  Novel Coronavirus,NAA,(SEND-OUT TO REF LAB - TAT 24-48 hrs); Hosp Order     Status: None   Collection Time: 12/08/18  3:03 AM   Specimen: Nasopharyngeal Swab; Respiratory  Result Value Ref Range Status   SARS-CoV-2, NAA NOT DETECTED NOT DETECTED Final    Comment: (NOTE) This  test was developed and its performance characteristics determined by [employer]. This test has not been FDA cleared or approved. This test has been authorized by FDA under an Emergency Use Authorization (EUA). This test is only authorized for the duration of time the declaration that circumstances exist justifying the authorization of the emergency use of in vitro diagnostic tests for detection of SARS-CoV-2 virus and/or diagnosis of COVID-19 infection under section 564(b)(1) of the Act, 21 U.S.C. 951OAC-1(Y)(6), unless the authorization is terminated or revoked sooner. When diagnostic testing is negative, the possibility of a false negative result should be considered in the context of a patient's recent exposures and the presence of clinical signs and symptoms consistent with COVID-19. An individual without symptoms of COVID-19 and who is not  shedding SARS-CoV-2 virus would expect to have a negative (not detected) result in this assay. Performed  At: [facility] [address] [place], [place] [id number] Rush Farmer MD XN:[id number]    [place]  Final    Comment: Performed at [facility], [place] [address], [place], [place] [address]     Time coordinating discharge:  39 minutes  SIGNED:   Georgette Shell, MD  [facility] 12/09/2018, 12:39 PM Pager   If 7PM-7AM, please contact night-coverage [email or web address] Password [email or web address]

## 2018-12-09 NOTE — Progress Notes (Signed)
Echocardiogram 2D Echocardiogram has been performed.  Matilde Bash 12/09/2018, 9:56 AM

## 2018-12-09 NOTE — Progress Notes (Signed)
Physical Therapy Treatment Patient Details Name: Clayton Odonnell MRN: [id number] DOB: 1931-06-22 Today's Date: 12/09/2018    History of Present Illness Pt is an 85 y/o male with a PMH significant for HTN, L femur IM nail 2015. He presents to the ED via EMS with vision changes and L-side sensory changes in face, hand, and lower leg/foot. 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    PT Comments    Pt progressing well with functional mobility. Continues to endorse L hand numbness in fingertips and L facial numbness. Appears slightly unsteady at times during gait training however gait speed is improved and pt did not require any assistance to recover from bouts of unsteadiness. Will continue to follow and progress as able per POC.    Follow Up Recommendations  Outpatient PT;Supervision - Intermittent     Equipment Recommendations  None recommended by PT    Recommendations for Other Services       Precautions / Restrictions Precautions Precautions: Fall Restrictions Weight Bearing Restrictions: No    Mobility  Bed Mobility Overal bed mobility: Modified Independent Bed Mobility: Supine to Sit     Supine to sit: Supervision     General bed mobility comments: No assist required.  Transfers Overall transfer level: Modified independent Equipment used: None             General transfer comment: pt powered-up to full stand without assistance and without LOB  Ambulation/Gait Ambulation/Gait assistance: Supervision Gait Distance (Feet): 500 Feet Assistive device: None Gait Pattern/deviations: Step-through pattern;Decreased stride length;Trunk flexed Gait velocity: Decreased Gait velocity interpretation: 1.31 - 2.62 ft/sec, indicative of limited community ambulator General Gait Details: VC's for improved posture. No  overt LOB noted however pt occasionally appeared mildly unsteady.    Stairs Stairs: Yes Stairs assistance: Min guard Stair Management: One rail Right;Alternating pattern;Forwards Number of Stairs: 5 General stair comments: Pt completed without assistance.    Wheelchair Mobility    Modified Rankin (Stroke Patients Only) Modified Rankin (Stroke Patients Only) Pre-Morbid Rankin Score: No symptoms Modified Rankin: Moderately severe disability     Balance Overall balance assessment: Modified Independent                                          Cognition Arousal/Alertness: Awake/alert Behavior During Therapy: WFL for tasks assessed/performed Overall Cognitive Status: Within Functional Limits for tasks assessed                                        Exercises Other Exercises Other Exercises: fine motor coordination    General Comments        Pertinent Vitals/Pain Pain Assessment: No/denies pain Faces Pain Scale: No hurt    Home Living Family/patient expects to be discharged to:: Private residence Living Arrangements: Alone                  Prior Function            PT Goals (current goals can now be found in the care plan section) Acute Rehab PT Goals Patient Stated Goal: to go home PT Goal Formulation: With patient Time For Goal Achievement: 12/15/18 Potential to Achieve  Goals: Good Progress towards PT goals: Progressing toward goals    Frequency    Min 4X/week      PT Plan Current plan remains appropriate    Co-evaluation              AM-PAC PT "6 Clicks" Mobility   Outcome Measure  Help needed turning from your back to your side while in a flat bed without using bedrails?: None Help needed moving from lying on your back to sitting on the side of a flat bed without using bedrails?: None Help needed moving to and from a bed to a chair (including a wheelchair)?: None Help needed standing up from a  chair using your arms (e.g., wheelchair or bedside chair)?: None Help needed to walk in hospital room?: A Little Help needed climbing 3-5 steps with a railing? : A Little 6 Click Score: 22    End of Session Equipment Utilized During Treatment: Gait belt Activity Tolerance: Patient tolerated treatment well Patient left: in chair;with call bell/phone within reach Nurse Communication: Mobility status PT Visit Diagnosis: Unsteadiness on feet (R26.81);Difficulty in walking, not elsewhere classified (R26.2);Other symptoms and signs involving the nervous system (M09.470)     Time: 9628-3662 PT Time Calculation (min) (ACUTE ONLY): 18 min  Charges:  $Gait Training: 8-22 mins                     Rolinda Roan, PT, DPT Acute Rehabilitation Services Pager: [phone] Office: [phone]    Thelma Comp 12/09/2018, 2:38 PM

## 2018-12-09 NOTE — Progress Notes (Signed)
Nurse went over discharge with patient. Patient verbalized understanding of discharge. All questions and concerns addressed. Discharging home with all belongings. Taking down in a wheelchair. 

## 2018-12-09 NOTE — TOC Transition Note (Addendum)
Transition of Care [facility]) - CM/SW Discharge Note   Patient Details  Name: Clayton Odonnell MRN: [id number] Date of Birth: 1930/12/15  [facility]) CM/SW Contact:  Zenon Mayo, RN Phone Number: 12/09/2018, 5:02 PM   Clinical Narrative:    Patient for dc today, will need HHPT, referral given to [facility] with [facility], awaiting to hear back if they can take patient. Per Butch Penny she states they are able to take patient but soc will be next week , patient states that is fine with him.     Final next level of care: [place] Barriers to Discharge: No Barriers Identified   Patient Goals and CMS Choice Patient states their goals for this hospitalization and ongoing recovery are:: get better CMS Medicare.gov Compare Post Acute Care list provided to:: Patient Choice offered to / list presented to : NA  Discharge Placement                       Discharge Plan and Services                DME Arranged: (NA)         HH Arranged: PT HH Agency: [place] ([place]) Date [place]: 12/09/18 Time [place]: 1702 Representative spoke with at [place]: [place] ([place]) Interventions     Readmission Risk Interventions No flowsheet data found.

## 2018-12-13 ENCOUNTER — Other Ambulatory Visit: Payer: Self-pay | Admitting: *Deleted

## 2018-12-13 DIAGNOSIS — Z7902 Long term (current) use of antithrombotics/antiplatelets: Secondary | ICD-10-CM | POA: Diagnosis not present

## 2018-12-13 DIAGNOSIS — Z7982 Long term (current) use of aspirin: Secondary | ICD-10-CM | POA: Diagnosis not present

## 2018-12-13 DIAGNOSIS — I10 Essential (primary) hypertension: Secondary | ICD-10-CM | POA: Diagnosis not present

## 2018-12-13 DIAGNOSIS — D329 Benign neoplasm of meninges, unspecified: Secondary | ICD-10-CM | POA: Diagnosis not present

## 2018-12-13 DIAGNOSIS — D352 Benign neoplasm of pituitary gland: Secondary | ICD-10-CM | POA: Diagnosis not present

## 2018-12-13 DIAGNOSIS — E785 Hyperlipidemia, unspecified: Secondary | ICD-10-CM | POA: Diagnosis not present

## 2018-12-13 DIAGNOSIS — Z8673 Personal history of transient ischemic attack (TIA), and cerebral infarction without residual deficits: Secondary | ICD-10-CM | POA: Diagnosis not present

## 2018-12-13 DIAGNOSIS — Z9181 History of falling: Secondary | ICD-10-CM | POA: Diagnosis not present

## 2018-12-13 DIAGNOSIS — Z87891 Personal history of nicotine dependence: Secondary | ICD-10-CM | POA: Diagnosis not present

## 2018-12-13 NOTE — Patient Outreach (Addendum)
[facility]) Care Management  12/13/2018  Clayton Odonnell [birth year] [id number]   EMMI- stroke  RED ON EMMI ALERT Day # 1 Date: Sunday 12/12/18 [place] Reason: Scheduled a follow-up appointment?No   Insurance: [employer] Medicare  [facility] admissions x 1 ED visits x 1 in the last 6 months    Outreach attempt #1 successful to the home number  Patient is able to verify HIPAA [facility] Care Management RN reviewed and addressed red alert with patient   EMMI: Clayton Odonnell confirms he is doing well He reports only residual weakness/numbness on his left side in his fingers, arm and face on the left side He states he has neurology and cardiology f/u appointments already He reports 2 calls to his primary care MD office for f/u appointment and is pending a return call after Clayton Odonnell arrives in the office on 12/14/18  He states he will contact CM if he does not receive a return call to schedule the primary MD appointment. He states Clayton Odonnell is in the office he goes to only certain days. He reports his last contact was for telehealth by the office NP about a week or so before his hospitalization   He denies any medical concerns today He reports his daughters are all taking great care of him and he believes he has minimal residual from the CVA. He hopes to recover well  Social: Clayton Odonnell is widowed but has lots of support he reports from his 6 daughters He also reports having a son. He reports he is able to complete his ADLs but his daughter completes all his iADLs. He reports he can drive and he can complete the iADLs but his daughters "won't let me" He reports them cleaning, paying bills and bringing food over daily. He states they assist with transportation as he permits. He reports he was driving prior to the hospitalization and is able to now if his daughters do not assist   Conditions: CVA  HTN, chronic constipation, pituitary adenoma, meningioma, closed left hip  fx, HLD, hyponatremia, right back pain   DME:He states he has his wife's DME, cane, walker, 3 n 1  Medications: he denies concerns with taking medications as prescribed, affording medications, side effects of medications and questions about medications   Appointments: 01/13/19 neurology f/u 01/18/19 cardiology f/u   Advance Directives: He states he has a living will and POA    Consent: [facility] RN CM reviewed [facility] services with patient. Patient gave verbal consent for services [facility] telephonic RN CM.   Advised patient that there will be further automated EMMI- post discharge calls to assess how the patient is doing following the recent hospitalization Advised the patient that another call may be received from a nurse if any of their responses were abnormal. Patient voiced understanding and was appreciative of f/u call.   Plan: [facility] RN CM will close case at this time as patient has been assessed and no needs identified/needs resolved.   Pt encouraged to return a call to [place] CM prn  [facility] RN CM sent a successful outreach letter as discussed with [facility] brochure enclosed for review    Clayton Vacca L. Lavina Hamman, RN, BSN, [place] Coordinator Office number [phone] Mobile number [phone]  Main [facility] number [phone] Fax number [phone]

## 2018-12-14 ENCOUNTER — Encounter: Payer: Self-pay | Admitting: *Deleted

## 2018-12-15 ENCOUNTER — Emergency Department (HOSPITAL_COMMUNITY)
Admission: EM | Admit: 2018-12-15 | Discharge: 2018-12-15 | Disposition: A | Discharge status: Home / Self Care | Payer: Medicare HMO | Source: Home / Self Care | Attending: Emergency Medicine | Admitting: Emergency Medicine

## 2018-12-15 ENCOUNTER — Other Ambulatory Visit: Payer: Self-pay

## 2018-12-15 ENCOUNTER — Emergency Department (HOSPITAL_COMMUNITY): Payer: Medicare HMO

## 2018-12-15 ENCOUNTER — Emergency Department (HOSPITAL_COMMUNITY)
Admission: EM | Admit: 2018-12-15 | Discharge: 2018-12-15 | Disposition: A | Discharge status: Home / Self Care | Payer: Medicare HMO | Attending: Emergency Medicine | Admitting: Emergency Medicine

## 2018-12-15 ENCOUNTER — Encounter (HOSPITAL_COMMUNITY): Payer: Self-pay | Admitting: Emergency Medicine

## 2018-12-15 DIAGNOSIS — Z87891 Personal history of nicotine dependence: Secondary | ICD-10-CM | POA: Insufficient documentation

## 2018-12-15 DIAGNOSIS — I10 Essential (primary) hypertension: Secondary | ICD-10-CM | POA: Diagnosis not present

## 2018-12-15 DIAGNOSIS — I440 Atrioventricular block, first degree: Secondary | ICD-10-CM | POA: Diagnosis not present

## 2018-12-15 DIAGNOSIS — R519 Headache, unspecified: Secondary | ICD-10-CM

## 2018-12-15 DIAGNOSIS — I4430 Unspecified atrioventricular block: Secondary | ICD-10-CM | POA: Diagnosis not present

## 2018-12-15 DIAGNOSIS — R51 Headache: Secondary | ICD-10-CM | POA: Insufficient documentation

## 2018-12-15 DIAGNOSIS — Z79899 Other long term (current) drug therapy: Secondary | ICD-10-CM | POA: Insufficient documentation

## 2018-12-15 DIAGNOSIS — R0902 Hypoxemia: Secondary | ICD-10-CM | POA: Diagnosis not present

## 2018-12-15 DIAGNOSIS — R9431 Abnormal electrocardiogram [ECG] [EKG]: Secondary | ICD-10-CM | POA: Diagnosis not present

## 2018-12-15 DIAGNOSIS — R52 Pain, unspecified: Secondary | ICD-10-CM | POA: Diagnosis not present

## 2018-12-15 DIAGNOSIS — I491 Atrial premature depolarization: Secondary | ICD-10-CM | POA: Diagnosis not present

## 2018-12-15 DIAGNOSIS — G459 Transient cerebral ischemic attack, unspecified: Secondary | ICD-10-CM | POA: Diagnosis not present

## 2018-12-15 LAB — BASIC METABOLIC PANEL
Anion gap: 6 (ref 5–15)
BUN: 15 mg/dL (ref 8–23)
CO2: 24 mmol/L (ref 22–32)
Calcium: 8.9 mg/dL (ref 8.9–10.3)
Chloride: 95 mmol/L — ABNORMAL LOW (ref 98–111)
Creatinine, Ser: 0.77 mg/dL (ref 0.61–1.24)
GFR calc Af Amer: >60 mL/min (ref >60)
GFR calc non Af Amer: >60 mL/min (ref >60)
Glucose, Bld: 95 mg/dL (ref 70–99)
Potassium: 4.3 mmol/L (ref 3.5–5.1)
Sodium: 125 mmol/L — ABNORMAL LOW (ref 135–145)

## 2018-12-15 LAB — CBC
HCT: 35 % — ABNORMAL LOW (ref 39.0–52.0)
Hemoglobin: 11.8 g/dL — ABNORMAL LOW (ref 13.0–17.0)
MCH: 30.4 pg (ref 26.0–34.0)
MCHC: 33.7 g/dL (ref 30.0–36.0)
MCV: 90.2 fL (ref 80.0–100.0)
Platelets: 225 10*3/uL (ref 150–400)
RBC: 3.88 MIL/uL — ABNORMAL LOW (ref 4.22–5.81)
RDW: 12.5 % (ref 11.5–15.5)
WBC: 5 10*3/uL (ref 4.0–10.5)
nRBC: 0 % (ref 0.0–0.2)

## 2018-12-15 LAB — SEDIMENTATION RATE: Sed Rate: 7 mm/hr (ref 0–16)

## 2018-12-15 MED ORDER — HYDROMORPHONE HCL 1 MG/ML IJ SOLN
0.5000 mg | Freq: Once | INTRAMUSCULAR | Status: AC
  Administered 2018-12-15: 0.5 mg via INTRAVENOUS
  Filled 2018-12-15: qty 1

## 2018-12-15 MED ORDER — ACETAMINOPHEN 500 MG PO TABS
1000.0000 mg | ORAL_TABLET | Freq: Once | ORAL | Status: AC
  Administered 2018-12-15: 1000 mg via ORAL
  Filled 2018-12-15: qty 2

## 2018-12-15 MED ORDER — DIPHENHYDRAMINE HCL 50 MG/ML IJ SOLN
12.5000 mg | Freq: Once | INTRAMUSCULAR | Status: AC
  Administered 2018-12-15: 12.5 mg via INTRAVENOUS
  Filled 2018-12-15: qty 1

## 2018-12-15 MED ORDER — BUTALBITAL-APAP-CAFFEINE 50-325-40 MG PO TABS: 1.0000 | ORAL_TABLET | Freq: Four times a day (QID) | ORAL | 0 refills | Status: AC | PRN

## 2018-12-15 MED ORDER — METOCLOPRAMIDE HCL 5 MG/ML IJ SOLN
10.0000 mg | Freq: Once | INTRAMUSCULAR | Status: AC
  Administered 2018-12-15: 10 mg via INTRAVENOUS
  Filled 2018-12-15: qty 2

## 2018-12-15 MED ORDER — SODIUM CHLORIDE 0.9 % IV BOLUS
500.0000 mL | Freq: Once | INTRAVENOUS | Status: AC
  Administered 2018-12-15: 500 mL via INTRAVENOUS

## 2018-12-15 MED ORDER — SODIUM CHLORIDE 0.9 % IV BOLUS
1000.0000 mL | Freq: Once | INTRAVENOUS | Status: AC
  Administered 2018-12-15: 1000 mL via INTRAVENOUS

## 2018-12-15 MED ORDER — KETOROLAC TROMETHAMINE 15 MG/ML IJ SOLN
15.0000 mg | Freq: Once | INTRAMUSCULAR | Status: AC
  Administered 2018-12-15: 15 mg via INTRAVENOUS
  Filled 2018-12-15: qty 1

## 2018-12-15 NOTE — ED Triage Notes (Signed)
Pt brought to ED by GEMs from home for c/o HA that is been on and off since yesterday morning. On EMS arrival pt's BP high 240/140, on ED arrival pt's BP 160/90, pt was seen here a couple days ago for stroke and was found several brain tumors on MRI. Pt is AO x 4 on arrival no neuro deficit. HR 90, R 19, SPO2 99 % on RA CBG 94. Pt still pending to follow up with Neurologist on the next couple of days.

## 2018-12-15 NOTE — ED Notes (Signed)
Spoke with pt daughter X 2 for update.

## 2018-12-15 NOTE — ED Notes (Signed)
[place] Therapist, sports updated pt daughter, pt daughter requests pt has stronger pain meds for DC.

## 2018-12-15 NOTE — ED Provider Notes (Signed)
[facility] EMERGENCY DEPARTMENT Provider Note   CSN: [id number] Arrival date & time: 12/15/18  0141     History   Chief Complaint Chief Complaint  Patient presents with  . Headache    HPI Clayton Odonnell is a 85 y.o. male.     Patient presents to the emergency department with a chief complaint of headache.  He states that the headache awakened him from sleep tonight.  He states that it was the worst headache he has ever experienced.  States that he called EMS, and had a blood pressure of 240/140.  In triage, patient's blood pressure is 181/69.  Patient was recently admitted for stroke, and was found to also have pituitary adenoma.  He is set to follow-up with neurology later this week.  Patient states that he had also had some minor headaches over the past couple of days which been intermittent, but nothing like what awakened him from sleep tonight.  He has taken Tylenol for pain with good relief in the past.  He denies any new numbness, weakness, tingling, slurred speech, or vision changes.  The history is provided by the patient. No language interpreter was used.    Past Medical History:  Diagnosis Date  . Hyperlipemia   . Hypertension   . Pneumonia   . Transfusion of blood product refused for religious reason     Patient Active Problem List   Diagnosis Date Noted  . Acute CVA (cerebrovascular accident) ([place]) 12/08/2018  . Meningioma ([place]) 12/08/2018  . Pituitary adenoma ([place]) 12/08/2018  . Hyponatremia 12/08/2018  . QT prolongation 12/08/2018  . Chest pain 06/27/2015  . Hyperlipidemia 06/27/2015  . Upper back pain on right side 06/27/2015  . Stress at home 06/27/2015  . Pain in the chest   . Chronic constipation 03/17/2014  . Essential hypertension, benign 03/14/2014  . Patient is Jehovah's Witness 03/14/2014  . Other and unspecified hyperlipidemia 03/14/2014  . Closed left hip fracture ([place]) 03/13/2014    Past Surgical History:   Procedure Laterality Date  . FEMUR IM NAIL Left 03/14/2014   Procedure: INTRAMEDULLARY (IM) NAIL FEMORAL;  Surgeon: Renette Butters, MD;  Location: [place];  Service: Orthopedics;  Laterality: Left;  . TONSILLECTOMY          Home Medications    Prior to Admission medications   Medication Sig Start Date End Date Taking? Authorizing Provider  acetaminophen (TYLENOL) 500 MG tablet Take 1,000 mg by mouth every 8 (eight) hours as needed (pain).    [provider]  aspirin EC 81 MG EC tablet Take 1 tablet (81 mg total) by mouth daily. 12/10/18   Georgette Shell, MD  clopidogrel (PLAVIX) 75 MG tablet Take 1 tablet (75 mg total) by mouth daily. 12/10/18   Georgette Shell, MD  folic acid (FOLVITE) 1 MG tablet Take 1 tablet (1 mg total) by mouth daily. 12/10/18   Georgette Shell, MD  losartan (COZAAR) 50 MG tablet Take 50 mg by mouth daily.    [provider]  Multiple Vitamin (MULTIVITAMIN WITH MINERALS) TABS tablet Take 1 tablet by mouth daily.    [provider]  pravastatin (PRAVACHOL) 20 MG tablet Take 1 tablet (20 mg total) by mouth daily at 6 PM. 12/09/18   Georgette Shell, MD  thiamine 100 MG tablet Take 1 tablet (100 mg total) by mouth daily. 12/10/18   Georgette Shell, MD    Family History Family History  Problem Relation Age  of Onset  . CAD Brother   . Osteoporosis Daughter     Social History Social History   Tobacco Use  . Smoking status: Former Smoker    Types: Cigarettes    Quit date: 03/13/1953    Years since quitting: 65.8  . Smokeless tobacco: Never Used  Substance Use Topics  . Alcohol use: Yes    Alcohol/week: 14.0 standard drinks    Types: 14 Cans of beer per week  . Drug use: No     Allergies   Tetanus toxoids and Penicillins   Review of Systems Review of Systems  All other systems reviewed and are negative.    Physical Exam Updated Vital Signs BP (!) 181/69 (BP Location: Right Arm)   Pulse (!) 57    Temp 97.8 F (36.6 C) (Oral)   Resp 14   Ht 5\' 10"  (1.778 m)   Wt 70.3 kg   SpO2 100%   BMI 22.24 kg/m   Physical Exam Vitals signs and nursing note reviewed.  Constitutional:      Appearance: He is well-developed.  HENT:     Head: Normocephalic and atraumatic.  Eyes:     Conjunctiva/sclera: Conjunctivae normal.  Neck:     Musculoskeletal: Neck supple.  Cardiovascular:     Rate and Rhythm: Normal rate and regular rhythm.     Heart sounds: No murmur.  Pulmonary:     Effort: Pulmonary effort is normal. No respiratory distress.     Breath sounds: Normal breath sounds.  Abdominal:     Palpations: Abdomen is soft.     Tenderness: There is no abdominal tenderness.  Musculoskeletal: Normal range of motion.  Skin:    General: Skin is warm and dry.  Neurological:     Mental Status: He is alert and oriented to person, place, and time.     Comments: CN 3-12 intact, speech is clear, movements are goal oriented, normal finger to nose, no pronator drift  Psychiatric:        Mood and Affect: Mood normal.        Behavior: Behavior normal.        Thought Content: Thought content normal.        Judgment: Judgment normal.      ED Treatments / Results  Labs (all labs ordered are listed, but only abnormal results are displayed) Labs Reviewed  CBC - Abnormal; Notable for the following components:      Result Value   RBC 3.88 (*)    Hemoglobin 11.8 (*)    HCT 35.0 (*)    All other components within normal limits  BASIC METABOLIC PANEL - Abnormal; Notable for the following components:   Sodium 125 (*)    Chloride 95 (*)    All other components within normal limits    EKG    Radiology Ct Head Wo Contrast  Result Date: 12/15/2018 CLINICAL DATA:  85 year old male with intermittent headaches. EXAM: CT HEAD WITHOUT CONTRAST TECHNIQUE: Contiguous axial images were obtained from the base of the skull through the vertex without intravenous contrast. COMPARISON:  Head CT dated  12/07/2018 and MRI dated 12/08/2018 FINDINGS: Brain: There is moderate age-related atrophy and chronic microvascular ischemic changes. There is no acute intracranial hemorrhage. No midline shift. No extra-axial fluid collection. Left anterior frontal meningioma measures approximately 13 x 30 mm. There is associated mild mass effect on the left frontal lobe. Enlargement of the pituitary fossa as seen previously suggestive of possible adenoma. Vascular: No hyperdense vessel or  unexpected calcification. Skull: Normal. Negative for fracture or focal lesion. Sinuses/Orbits: No acute finding. Other: None IMPRESSION: 1. No acute intracranial hemorrhage. 2. Moderate age-related atrophy and chronic microvascular ischemic changes. 3. Left anterior frontal meningioma. 4. Enlargement of the pituitary fossa likely representing a pituitary adenoma. Electronically Signed   By: Anner Crete M.D.   On: 12/15/2018 02:37    Procedures Procedures (including critical care time)  Medications Ordered in ED Medications  sodium chloride 0.9 % bolus 1,000 mL (has no administration in time range)  acetaminophen (TYLENOL) tablet 1,000 mg (1,000 mg Oral Given 12/15/18 0215)     Initial Impression / Assessment and Plan / ED Course  I have reviewed the triage vital signs and the nursing notes.  Pertinent labs & imaging results that were available during my care of the patient were reviewed by me and considered in my medical decision making (see chart for details).        Patient here with headache.  Was recently admitted after having had a stroke.  Has neurology follow-up in the coming week or so.  Was awakened from sleep at approximately 1 AM with severe headache.  He states this was the worst headache he is experienced.  CT scan ordered.  CT head is negative, and was completed within 6-hour window, doubt subarachnoid hemorrhage.  Case discussed with Dr. Leonel Ramsay, who agrees that hemorrhages unlikely and no  further work-up indicated at this time.  Will discharge patient home with urology follow-up.  Patient seen by and discussed with Dr. Dina Rich, who agrees with the plan.  Pt HA treated and improved while in ED. Pt is afebrile with no focal neuro deficits, nuchal rigidity, or change in vision.    Final Clinical Impressions(s) / ED Diagnoses   Final diagnoses:  Nonintractable headache, unspecified chronicity pattern, unspecified headache type    ED Discharge Orders    None       Montine Circle, PA-C 12/15/18 0350    Merryl Hacker, MD 12/15/18 [phone]

## 2018-12-15 NOTE — ED Triage Notes (Signed)
Pt back to the hospital this morning for c/o headache pt was just d/c 'd from ED tHis aM

## 2018-12-19 NOTE — ED Provider Notes (Signed)
[facility] EMERGENCY DEPARTMENT Provider Note   CSN: [id number] Arrival date & time: 12/15/18  6967     History   Chief Complaint Chief Complaint  Patient presents with  . Headache    HPI Clayton Odonnell is a 85 y.o. male.     HPI   88yM with HA. Recently seen for the same. Says was feeling better when discharged but then returning again. Had negative w/u including imaging. Says head hurts from the front to the back. No acute visual changes. No acute numbness, tingling or loss of strength.   Past Medical History:  Diagnosis Date  . Hyperlipemia   . Hypertension   . Pneumonia   . Transfusion of blood product refused for religious reason     Patient Active Problem List   Diagnosis Date Noted  . Acute CVA (cerebrovascular accident) ([place]) 12/08/2018  . Meningioma ([place]) 12/08/2018  . Pituitary adenoma ([place]) 12/08/2018  . Hyponatremia 12/08/2018  . QT prolongation 12/08/2018  . Chest pain 06/27/2015  . Hyperlipidemia 06/27/2015  . Upper back pain on right side 06/27/2015  . Stress at home 06/27/2015  . Pain in the chest   . Chronic constipation 03/17/2014  . Essential hypertension, benign 03/14/2014  . Patient is Jehovah's Witness 03/14/2014  . Other and unspecified hyperlipidemia 03/14/2014  . Closed left hip fracture ([place]) 03/13/2014    Past Surgical History:  Procedure Laterality Date  . FEMUR IM NAIL Left 03/14/2014   Procedure: INTRAMEDULLARY (IM) NAIL FEMORAL;  Surgeon: Renette Butters, MD;  Location: [facility];  Service: Orthopedics;  Laterality: Left;  . TONSILLECTOMY          Home Medications    Prior to Admission medications   Medication Sig Start Date End Date Taking? Authorizing Provider  acetaminophen (TYLENOL) 500 MG tablet Take 1,000 mg by mouth every 8 (eight) hours as needed (pain).    [provider]  aspirin EC 81 MG EC tablet Take 1 tablet (81 mg total) by mouth daily. 12/10/18   Georgette Shell, MD   butalbital-acetaminophen-caffeine [facility]) [phone] MG tablet Take 1 tablet by mouth every 6 (six) hours as needed for headache. 12/15/18 12/15/19  Virgel Manifold, MD  clopidogrel (PLAVIX) 75 MG tablet Take 1 tablet (75 mg total) by mouth daily. 12/10/18   Georgette Shell, MD  folic acid (FOLVITE) 1 MG tablet Take 1 tablet (1 mg total) by mouth daily. 12/10/18   Georgette Shell, MD  losartan (COZAAR) 50 MG tablet Take 50 mg by mouth daily.    [provider]  Multiple Vitamin (MULTIVITAMIN WITH MINERALS) TABS tablet Take 1 tablet by mouth daily.    [provider]  pravastatin (PRAVACHOL) 20 MG tablet Take 1 tablet (20 mg total) by mouth daily at 6 PM. 12/09/18   Georgette Shell, MD  thiamine 100 MG tablet Take 1 tablet (100 mg total) by mouth daily. 12/10/18   Georgette Shell, MD    Family History Family History  Problem Relation Age of Onset  . CAD Brother   . Osteoporosis Daughter     Social History Social History   Tobacco Use  . Smoking status: Former Smoker    Types: Cigarettes    Quit date: 03/13/1953    Years since quitting: 65.8  . Smokeless tobacco: Never Used  Substance Use Topics  . Alcohol use: Yes    Alcohol/week: 14.0 standard drinks    Types: 14 Cans of beer per week  .  Drug use: No     Allergies   Tetanus toxoids and Penicillins   Review of Systems Review of Systems  All systems reviewed and negative, other than as noted in HPI.  Physical Exam Updated Vital Signs BP (!) 175/64   Pulse (!) 53   Temp 97.8 F (36.6 C)   Resp 14   SpO2 96%   Physical Exam Vitals signs and nursing note reviewed.  Constitutional:      General: He is not in acute distress.    Appearance: He is well-developed.  HENT:     Head: Normocephalic and atraumatic.  Eyes:     General:        Right eye: No discharge.        Left eye: No discharge.     Conjunctiva/sclera: Conjunctivae normal.  Neck:     Musculoskeletal: Neck supple. No  neck rigidity or muscular tenderness.  Cardiovascular:     Rate and Rhythm: Normal rate and regular rhythm.     Heart sounds: Normal heart sounds. No murmur. No friction rub. No gallop.   Pulmonary:     Effort: Pulmonary effort is normal. No respiratory distress.     Breath sounds: Normal breath sounds.  Abdominal:     General: There is no distension.     Palpations: Abdomen is soft.     Tenderness: There is no abdominal tenderness.  Musculoskeletal:        General: No tenderness.  Skin:    General: Skin is warm and dry.  Neurological:     General: No focal deficit present.     Mental Status: He is alert.     Cranial Nerves: No cranial nerve deficit.     Sensory: No sensory deficit.     Motor: No weakness.  Psychiatric:        Behavior: Behavior normal.        Thought Content: Thought content normal.      ED Treatments / Results  Labs (all labs ordered are listed, but only abnormal results are displayed) Labs Reviewed  SEDIMENTATION RATE    EKG    Radiology No results found.  Procedures Procedures (including critical care time)  Medications Ordered in ED Medications  ketorolac (TORADOL) 15 MG/ML injection 15 mg (15 mg Intravenous Given 12/15/18 0941)  metoCLOPramide (REGLAN) injection 10 mg (10 mg Intravenous Given 12/15/18 0942)  diphenhydrAMINE (BENADRYL) injection 12.5 mg (12.5 mg Intravenous Given 12/15/18 0941)  sodium chloride 0.9 % bolus 500 mL (0 mLs Intravenous Stopped 12/15/18 1036)  HYDROmorphone (DILAUDID) injection 0.5 mg (0.5 mg Intravenous Given 12/15/18 1236)     Initial Impression / Assessment and Plan / ED Course  I have reviewed the triage vital signs and the nursing notes.  Pertinent labs & imaging results that were available during my care of the patient were reviewed by me and considered in my medical decision making (see chart for details).        88yM with HA. Recently seen for the same. Neuro exam baseline. ESR normal. HA improved  with meds. Will DC with prn prescription. Neuro FU. Return precautions discussed.   Final Clinical Impressions(s) / ED Diagnoses   Final diagnoses:  Acute nonintractable headache, unspecified headache type    ED Discharge Orders         Ordered    butalbital-acetaminophen-caffeine (FIORICET) 50-325-40 MG tablet  Every 6 hours PRN     12/15/18 [place],  Annie Main, MD 12/19/18 1428

## 2019-01-13 ENCOUNTER — Inpatient Hospital Stay: Payer: Medicare HMO | Admitting: Adult Health

## 2019-01-13 NOTE — Progress Notes (Deleted)
[facility] [address] [place]. Clayton Odonnell [address] [phone]       [place]  Clayton Odonnell Date of Birth:  1930-08-26 Medical Record Number:  [id number]   Reason for Referral:  hospital stroke follow up    CHIEF COMPLAINT:  No chief complaint on file.   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  History obtained from *** and chart review. Reviewed all radiology images and labs personally.  Clayton Odonnell is a 85 y.o. male with history of HTN, HLD  who presented to [facility] ED on 12/07/2018 with transient blurred vision in the OS and numbness in the L hand and face with HA.  Stroke work up revealed right thalamic infarct secondary to small vessel disease source.  CT head no acute abnormality with a stable left frontal meningioma, fullness of pituitary fossa with questionable pituitary adenoma, small vessel disease and atrophy.  CTA head unremarkable.  CTA neck fibrofatty plaque left ICA bifurcation 60%.  MRI showed right lateral thalamic infarct, left anterior frontal meningioma 30 mm with mild mass-effect on left frontal lobe, enlarged pituitary with partial effacement suprasellar cistern with suspected pituitary adenoma.  2D echo ***.  LDL 78 and A1c 5.4.  Recommend aspirin and Plavix for 3 weeks then aspirin alone.  As recent meningioma demonstrated on imaging which was not present on imaging 5 years prior concern for malignancy given fast growth and recommend following up outpatient with neurosurgery along with obtaining MRI w/wo contrast.  Asymptomatic left carotid stenosis and recommended VVS outpatient follow-up.  HTN and HLD stable.  Clinic stroke risk factors include advanced age, former tobacco use and EtOH use but no prior history of stroke.  Residual left-sided numbness but resolution of headache and was  discharged home in stable condition with recommendations of PT/OT.  He returned to ED on 12/15/2018 with complaints of worse headache he has ever experienced which awaken him from a sleep.  Blood pressure per EMS 240/140.  He denies new numbness, weakness, tingling, slurred speech or vision changes.  CT head negative therefore do not recommend any additional work-up at that time.  Headache improved with medication treatment and discharged home. Return to ED that evening after being discharged due to worsening headache as it resolved prior to being discharged earlier.  Denied visual changes, acute numbness/tingling or weakness.  Assessment and work-up unremarkable.  Headache improved with medication and was discharged home on Fioricet.     ROS:   14 system review of systems performed and negative with exception of ***  PMH:  Past Medical History:  Diagnosis Date   Hyperlipemia    Hypertension    Pneumonia    Transfusion of blood product refused for religious reason     PSH:  Past Surgical History:  Procedure Laterality Date   FEMUR IM NAIL Left 03/14/2014   Procedure: INTRAMEDULLARY (IM) NAIL FEMORAL;  Surgeon: Renette Butters, MD;  Location: [facility];  Service: Orthopedics;  Laterality: Left;   TONSILLECTOMY      Social History:  Social History   Socioeconomic History   Marital status: Widowed    Spouse name: Not on file   Number of children: 7   Years of education: Not on file   Highest education level: Not on file  Occupational History    Comment: reitred   Scientist, 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 at all   Food insecurity    Worry: Never true    Inability: Never true   Transportation needs    Medical: No    Non-medical: No  Tobacco Use   Smoking status: Former Smoker    Types: Cigarettes    Quit date: 03/13/1953    Years since quitting: 65.8   Smokeless tobacco: Never Used  Substance and Sexual Activity   Alcohol use: Yes    Alcohol/week:  14.0 standard drinks    Types: 14 Cans of beer per week   Drug use: No   Sexual activity: Not on file  Lifestyle   Physical activity    Days per week: Not on file    Minutes per session: Not on file   Stress: Not on file  Relationships   Social connections    Talks on phone: More than three times a week    Gets together: More than three times a week    Attends religious service: Not on file    Active member of club or organization: Not on file    Attends meetings of clubs or organizations: Not on file    Relationship status: Widowed   Intimate partner violence    Fear of current or ex partner: No    Emotionally abused: No    Physically abused: No    Forced sexual activity: No  Other Topics Concern   Not on file  Social History Narrative   Not on file    Family History:  Family History  Problem Relation Age of Onset   CAD Brother    Osteoporosis Daughter     Medications:   Current Outpatient Medications on File Prior to Visit  Medication Sig Dispense Refill   acetaminophen (TYLENOL) 500 MG tablet Take 1,000 mg by mouth every 8 (eight) hours as needed (pain).     aspirin EC 81 MG EC tablet Take 1 tablet (81 mg total) by mouth daily.     butalbital-acetaminophen-caffeine (FIORICET) 50-325-40 MG tablet Take 1 tablet by mouth every 6 (six) hours as needed for headache. 12 tablet 0   clopidogrel (PLAVIX) 75 MG tablet Take 1 tablet (75 mg total) by mouth daily. 30 tablet 2   folic acid (FOLVITE) 1 MG tablet Take 1 tablet (1 mg total) by mouth daily.     losartan (COZAAR) 50 MG tablet Take 50 mg by mouth daily.     Multiple Vitamin (MULTIVITAMIN WITH MINERALS) TABS tablet Take 1 tablet by mouth daily.     pravastatin (PRAVACHOL) 20 MG tablet Take 1 tablet (20 mg total) by mouth daily at 6 PM. 30 tablet 0   thiamine 100 MG tablet Take 1 tablet (100 mg total) by mouth daily.     No current facility-administered medications on file prior to visit.      Allergies:   Allergies  Allergen Reactions   Tetanus Toxoids    Penicillins Hives and Rash    Did it involve swelling of the face/tongue/throat, SOB, or low BP? No Did it involve sudden or severe rash/hives, skin peeling, or any reaction on the inside of your mouth or nose? Yes Did you need to seek medical attention at a hospital or doctor's office? No When did it last happen?Unknown If all above answers are NO, may proceed with cephalosporin use.      Physical Exam  There were no vitals filed for this visit. There is no height or weight on file to calculate BMI.  No exam data present  Depression screen [facility] 2/9 12/13/2018  Decreased Interest 0  Down, Depressed, Hopeless 0  PHQ - 2 Score 0     General: well developed, well nourished, seated, in no evident distress Head: head normocephalic and atraumatic.   Neck: supple with no carotid or supraclavicular bruits Cardiovascular: regular rate and rhythm, no murmurs Musculoskeletal: no deformity Skin:  no rash/petichiae Vascular:  Normal pulses all extremities   Neurologic Exam Mental Status: Awake and fully alert. Oriented to place and time. Recent and remote memory intact. Attention span, concentration and fund of knowledge appropriate. Mood and affect appropriate.  Cranial Nerves: Fundoscopic exam reveals sharp disc margins. Pupils equal, briskly reactive to light. Extraocular movements full without nystagmus. Visual fields full to confrontation. Hearing intact. Facial sensation intact. Face, tongue, palate moves normally and symmetrically.  Motor: Normal bulk and tone. Normal strength in all tested extremity muscles. Sensory.: intact to touch , pinprick , position and vibratory sensation.  Coordination: Rapid alternating movements normal in all extremities. Finger-to-nose and heel-to-shin performed accurately bilaterally. Gait and Station: Arises from chair without difficulty. Stance is normal. Gait demonstrates  normal stride length and balance Reflexes: 1+ and symmetric. Toes downgoing.     NIHSS  *** Modified Rankin  *** CHA2DS2-VASc *** HAS-BLED ***   Diagnostic Data (Labs, Imaging, Testing)   CT head No acute stroke. Stable L frontal meningioma. Fullness of pituitary fossa, ? Pituitary adenoma. Stable small vessel disease and atrophy.      CTA head Unremarkable   CTA neck fibrofatty plaque L ICA  Bifurcation 60%  MRI  R lateral thalamic infarct. L anterior frontal meningioma 60mm w/ mild mass effect on L frontal lobe. Enlarged pituitary w/ partial effacement suprasellar cistern, suspect pituitary adenoma.   2D echo EF greater than 65%  LDL 78  HgbA1c 5.4    ASSESSMENT: Clayton Odonnell is a 85 y.o. 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  . Vascular risk factors include HTN, HLD, potential pituitary adenoma, questionable malignancy within the left frontal meningioma, left carotid stenosis, advanced age, former tobacco use and EtOH use.     PLAN:  1. *** : Continue {anticoagulants:31417}  and ***  for secondary stroke prevention. Maintain strict control of hypertension with blood pressure goal below 130/90, diabetes with hemoglobin A1c goal below 6.5% and cholesterol with LDL cholesterol (bad cholesterol) goal below 70 mg/dL.  I also advised the patient to eat a healthy diet with plenty of whole grains, cereals, fruits and vegetables, exercise regularly with at least 30 minutes of continuous activity daily and maintain ideal body weight. 2. HTN: Advised to continue current treatment regimen.  Today's BP ***.  Advised to continue to monitor at home along with continued follow-up with PCP for management 3. HLD: Advised to continue current treatment regimen along with continued follow-up with PCP for future prescribing and  monitoring of lipid panel 4. DMII: Advised to continue to monitor glucose levels at home along with continued follow-up with PCP for management and monitoring    Follow up in *** or call earlier if needed   Greater than 50% of time during this 45 minute visit was spent on counseling, explanation of diagnosis of ***, reviewing risk factor management of ***, planning of further management along with potential future management, and discussion with patient and family answering all questions.  Venancio Poisson, AGNP-BC  [facility] [address] [place], [place] [address]  Phone [phone] Fax [phone] Note: This document was prepared with digital dictation and possible smart phrase technology. Any transcriptional errors that result from this process are unintentional.

## 2019-01-18 ENCOUNTER — Encounter: Payer: Medicare HMO | Admitting: Vascular Surgery

## 2019-01-22 DEATH — deceased
